# Patient Record
Sex: Female | Born: 1980 | Race: White | Hispanic: No | Marital: Married | Smoking: Former smoker
Health system: Southern US, Community
[De-identification: ages and names within clinical notes are randomized; demographics above are authoritative.]

## PROBLEM LIST (undated history)

## (undated) DIAGNOSIS — F419 Anxiety disorder, unspecified: Secondary | ICD-10-CM

## (undated) DIAGNOSIS — G629 Polyneuropathy, unspecified: Secondary | ICD-10-CM

## (undated) HISTORY — DX: Polyneuropathy, unspecified: G62.9

## (undated) HISTORY — PX: TONSILLECTOMY: SUR1361

## (undated) HISTORY — DX: Anxiety disorder, unspecified: F41.9

---

## 2010-10-21 ENCOUNTER — Encounter: Payer: Self-pay | Admitting: Family Medicine

## 2010-10-21 ENCOUNTER — Ambulatory Visit (INDEPENDENT_AMBULATORY_CARE_PROVIDER_SITE_OTHER): Payer: BC Managed Care – PPO | Admitting: Family Medicine

## 2010-10-21 VITALS — BP 143/81 | HR 94 | Ht 65.0 in | Wt 203.0 lb

## 2010-10-21 DIAGNOSIS — G629 Polyneuropathy, unspecified: Secondary | ICD-10-CM

## 2010-10-21 DIAGNOSIS — G43009 Migraine without aura, not intractable, without status migrainosus: Secondary | ICD-10-CM

## 2010-10-21 DIAGNOSIS — G589 Mononeuropathy, unspecified: Secondary | ICD-10-CM

## 2010-10-21 MED ORDER — GABAPENTIN 300 MG PO CAPS: 300.0000 mg | ORAL_CAPSULE | Freq: Every day | ORAL | Status: DC

## 2010-10-21 NOTE — Patient Instructions (Signed)
WE will call you with the referral to Neurology.

## 2010-10-21 NOTE — Progress Notes (Signed)
Subjective:    Patient ID: Leah Wells, female    DOB: 1980-08-03, 30 y.o.   MRN: [id number]  HPI 2 years ago started having severe HA, no sensitivity to light. They lasted for day.  Neg Head CT.  Would get facial swelling at the same time.   Saw Allergist who said she was allergic to dust mites  HA got better overall.  Lets started swelling around the ankles. Then had an echo and then the cardiologist who said the she had wall thickening.  Then saw another cardiologist because felt she really couldn't understand him.  Second cardiologist said she didn't need bp meds and felt she had no heart problems.  Was having SOB with the CP.  Dr. Beverely Pace at [facility].   Last week her right side felt like it was burning. Right now both legs are burning.  Will get shooting pains on her right side.   Her right arm, right leg and right eye were swollen at the same time. Saw PCP. Then next day felt like she couldn't feel her left side.  She felt lightheaded so went ahead to the ED.  Went to the [facility] ED and did a CXR and EKG and these were normal so went home. Next day still felt poorly.  Missed 3 days of work.  Was taking vicodin to get through work on Monday. Then went to [facility] ED and they did a Head CT and it ws neg.  She was dx with neuropathy and she was put on neuropathy.   Leg and arm swollen is even between the joints.    Cousin with Lupus.   Mom with fibroyalgia.   MGM with DM, TIAs, diabetic neuropahty PGM with renal failure and MIm Pat aunt with MS, crohns dz   Review of Systems     Objective:   Physical Exam  Constitutional: She is oriented to person, place, and time. She appears well-developed and well-nourished. No distress.  HENT:  Head: Normocephalic and atraumatic.  Eyes: Conjunctivae and EOM are normal. Pupils are equal, round, and reactive to light.  Neck: Neck supple. No thyromegaly present.  Cardiovascular: Normal rate, regular rhythm and normal heart sounds.         Normal radial pulses.   Pulmonary/Chest: Effort normal and breath sounds normal.  Musculoskeletal: She exhibits no edema.  Lymphadenopathy:    She has no cervical adenopathy.  Neurological: She is alert and oriented to person, place, and time. She has normal strength and normal reflexes. She displays normal reflexes. No cranial nerve deficit. She exhibits normal muscle tone. She displays a negative Romberg sign. Coordination normal.  Reflex Scores:      Tricep reflexes are 2+ on the right side and 2+ on the left side.      Bicep reflexes are 2+ on the right side and 2+ on the left side.      Brachioradialis reflexes are 2+ on the right side and 2+ on the left side.      Patellar reflexes are 2+ on the right side and 2+ on the left side. Skin: Skin is warm and dry.  Psychiatric: She has a normal mood and affect. Her behavior is normal. Judgment and thought content normal.          Assessment & Plan:  Neuropathy possible. Unclear etiololgy. Head CT has ruled out stroke or large tumor.  She has normal neuro exam today. Her pain has improved on the gabapentin. Will continue med for  now since getting benefit. Will refer to neuro for further eval. This doesn' t sound like SLE or MS.  She also brought in copies of her recent lab work that showed a neg ANA, normal sed rate, thyroid, liver, kidneys, etc. B12 was borderline.  Rec we recheck in 1 months. She recently had  B12 injections.  Explained taht B12 can cause neuropathy but does quite fit with the pattern that she is experiencing.    Migraines - BAsed on the severity of her HA I do think she likely has migraines. Neurontin may help her HA as well. Consider trial of tryptan. 30 + min spent face to face.

## 2010-10-23 DIAGNOSIS — G43009 Migraine without aura, not intractable, without status migrainosus: Secondary | ICD-10-CM | POA: Insufficient documentation

## 2010-10-24 ENCOUNTER — Encounter: Payer: Self-pay | Admitting: Family Medicine

## 2010-10-24 NOTE — Progress Notes (Signed)
  Subjective:    Patient ID: Leah Wells, female    DOB: 04/27/1981, 30 y.o.   MRN: [id number]  HPI    Review of Systems  Constitutional: Negative for fever, diaphoresis and unexpected weight change.  HENT: Positive for rhinorrhea, sneezing and postnasal drip. Negative for hearing loss and tinnitus.   Eyes: Positive for visual disturbance.  Respiratory: Positive for cough. Negative for wheezing.   Cardiovascular: Positive for chest pain. Negative for palpitations.  Gastrointestinal: Positive for nausea. Negative for vomiting, diarrhea and blood in stool.  Genitourinary: Negative for vaginal bleeding, vaginal discharge and difficulty urinating.  Musculoskeletal: Positive for myalgias and arthralgias.  Skin: Negative for rash.  Neurological: Positive for headaches.  Hematological: Negative for adenopathy. Does not bruise/bleed easily.  Psychiatric/Behavioral: Negative for sleep disturbance and dysphoric mood. The patient is nervous/anxious.        Objective:   Physical Exam        Assessment & Plan:

## 2010-11-01 ENCOUNTER — Telehealth: Payer: Self-pay | Admitting: Family Medicine

## 2010-11-01 DIAGNOSIS — I517 Cardiomegaly: Secondary | ICD-10-CM | POA: Insufficient documentation

## 2010-11-01 DIAGNOSIS — G4730 Sleep apnea, unspecified: Secondary | ICD-10-CM | POA: Insufficient documentation

## 2010-11-01 DIAGNOSIS — M2550 Pain in unspecified joint: Secondary | ICD-10-CM

## 2010-11-01 DIAGNOSIS — D510 Vitamin B12 deficiency anemia due to intrinsic factor deficiency: Secondary | ICD-10-CM | POA: Insufficient documentation

## 2010-11-01 DIAGNOSIS — F411 Generalized anxiety disorder: Secondary | ICD-10-CM

## 2010-11-01 DIAGNOSIS — R5383 Other fatigue: Secondary | ICD-10-CM

## 2010-11-01 DIAGNOSIS — E669 Obesity, unspecified: Secondary | ICD-10-CM

## 2010-11-01 NOTE — Telephone Encounter (Signed)
Chart updated from old records.

## 2010-11-02 ENCOUNTER — Telehealth: Payer: Self-pay | Admitting: *Deleted

## 2010-11-02 DIAGNOSIS — M79606 Pain in leg, unspecified: Secondary | ICD-10-CM

## 2010-11-02 NOTE — Telephone Encounter (Signed)
We can increase the neurontin adn set her up for a vascular study to make sure doesn't have a DVT. Also let get labs to rule out infection, etc.

## 2010-11-02 NOTE — Telephone Encounter (Signed)
Pt notified.will set up vascular study

## 2010-11-02 NOTE — Telephone Encounter (Signed)
Pt was seen in the office a few days ago for leg pain by Dr. Linford Arnold.Pt called today crying because she states the pain is so bad that she can barley walk.Pt states the neurontin  has not helped at all.Please advise

## 2010-11-03 ENCOUNTER — Other Ambulatory Visit: Payer: Self-pay | Admitting: Family Medicine

## 2010-11-03 LAB — CBC WITH DIFFERENTIAL/PLATELET
Basophils Absolute: 0 10*3/uL (ref 0.0–0.1)
HCT: 40.8 % (ref 36.0–46.0)
Hemoglobin: 13.2 g/dL (ref 12.0–15.0)
Lymphocytes Relative: 36 % (ref 12–46)
Lymphs Abs: 2.3 10*3/uL (ref 0.7–4.0)
Monocytes Absolute: 0.3 10*3/uL (ref 0.1–1.0)
Monocytes Relative: 5 % (ref 3–12)
Neutro Abs: 3.7 10*3/uL (ref 1.7–7.7)
RBC: 4.41 MIL/uL (ref 3.87–5.11)
WBC: 6.4 10*3/uL (ref 4.0–10.5)

## 2010-11-03 MED ORDER — GABAPENTIN 300 MG PO CAPS: 300.0000 mg | ORAL_CAPSULE | Freq: Two times a day (BID) | ORAL | Status: DC

## 2010-11-04 ENCOUNTER — Telehealth: Payer: Self-pay | Admitting: *Deleted

## 2010-11-04 LAB — COMPLETE METABOLIC PANEL WITH GFR
ALT: 16 U/L (ref 0–35)
AST: 18 U/L (ref 0–37)
Creat: 0.91 mg/dL (ref 0.40–1.20)
Sodium: 141 mEq/L (ref 135–145)
Total Bilirubin: 0.5 mg/dL (ref 0.3–1.2)

## 2010-11-04 LAB — D-DIMER, QUANTITATIVE: D-Dimer, Quant: 0.26 ug/mL-FEU (ref 0.00–0.48)

## 2010-11-04 NOTE — Telephone Encounter (Signed)
Left message on pt vm that venous doppler scheduled for 11/07/10 at 10am in [facility]

## 2010-11-07 ENCOUNTER — Telehealth: Payer: Self-pay | Admitting: Family Medicine

## 2010-11-07 NOTE — Telephone Encounter (Signed)
Misty Stanley called from here at the [facility]. To determine more specific results regarding vascular study.   Plan:  Told to do bilateral vascular study for both legs since both hurting but Rt leg worse.  R/o DVT and leg pain is the diagnosis. Jarvis Newcomer, LPN [facility]

## 2010-11-07 NOTE — Telephone Encounter (Signed)
Contacted pt.  Had to [facility] that test negative.  Told to keep neuro referral. Jarvis Newcomer, LPN [facility]

## 2010-11-07 NOTE — Telephone Encounter (Signed)
Call pt: Please let know it is negative. Thank you!!!!!!She already has a neuro referral.

## 2010-11-07 NOTE — Telephone Encounter (Signed)
Misty Stanley in [place] called with oral report.  Bilateral venous ultrasound negative for clots in either RT or LT legs. Plan:  Routed report to Dr. Marlyne Beards, LPN [facility]

## 2010-11-09 ENCOUNTER — Telehealth: Payer: Self-pay | Admitting: Family Medicine

## 2010-11-09 NOTE — Telephone Encounter (Signed)
Pt called and wanted her recent lab results and faxed to her. Plan:  Labs faxed to pt per her request. Jarvis Newcomer, LPN [facility]

## 2010-11-11 ENCOUNTER — Encounter: Payer: Self-pay | Admitting: Emergency Medicine

## 2010-11-11 ENCOUNTER — Inpatient Hospital Stay (INDEPENDENT_AMBULATORY_CARE_PROVIDER_SITE_OTHER)
Admission: RE | Admit: 2010-11-11 | Discharge: 2010-11-11 | Disposition: A | Discharge status: Home / Self Care | Payer: BC Managed Care – PPO | Source: Ambulatory Visit | Attending: Emergency Medicine | Admitting: Emergency Medicine

## 2010-11-11 DIAGNOSIS — J069 Acute upper respiratory infection, unspecified: Secondary | ICD-10-CM

## 2010-11-23 ENCOUNTER — Encounter: Payer: Self-pay | Admitting: Family Medicine

## 2011-04-16 ENCOUNTER — Inpatient Hospital Stay (INDEPENDENT_AMBULATORY_CARE_PROVIDER_SITE_OTHER)
Admission: RE | Admit: 2011-04-16 | Discharge: 2011-04-16 | Disposition: A | Discharge status: Home / Self Care | Payer: BC Managed Care – PPO | Source: Ambulatory Visit | Attending: Family Medicine | Admitting: Family Medicine

## 2011-04-16 ENCOUNTER — Encounter: Payer: Self-pay | Admitting: Family Medicine

## 2011-04-16 DIAGNOSIS — R599 Enlarged lymph nodes, unspecified: Secondary | ICD-10-CM

## 2011-04-16 DIAGNOSIS — H9209 Otalgia, unspecified ear: Secondary | ICD-10-CM

## 2011-04-16 LAB — CONVERTED CEMR LAB: Rapid Strep: NEGATIVE

## 2011-05-30 ENCOUNTER — Encounter: Payer: Self-pay | Admitting: Family Medicine

## 2011-05-30 ENCOUNTER — Ambulatory Visit (INDEPENDENT_AMBULATORY_CARE_PROVIDER_SITE_OTHER): Payer: BC Managed Care – PPO | Admitting: Family Medicine

## 2011-05-30 VITALS — BP 140/89 | HR 81 | Temp 98.2°F | Ht 63.0 in | Wt 178.0 lb

## 2011-05-30 DIAGNOSIS — J209 Acute bronchitis, unspecified: Secondary | ICD-10-CM

## 2011-05-30 DIAGNOSIS — Z23 Encounter for immunization: Secondary | ICD-10-CM

## 2011-05-30 DIAGNOSIS — N926 Irregular menstruation, unspecified: Secondary | ICD-10-CM

## 2011-05-30 DIAGNOSIS — N912 Amenorrhea, unspecified: Secondary | ICD-10-CM

## 2011-05-30 DIAGNOSIS — J3489 Other specified disorders of nose and nasal sinuses: Secondary | ICD-10-CM

## 2011-05-30 DIAGNOSIS — J0100 Acute maxillary sinusitis, unspecified: Secondary | ICD-10-CM

## 2011-05-30 MED ORDER — FEXOFENADINE-PSEUDOEPHED ER 180-240 MG PO TB24: 1.0000 | ORAL_TABLET | Freq: Every day | ORAL | Status: DC

## 2011-05-30 MED ORDER — HYDROCOD POLST-CHLORPHEN POLST 10-8 MG/5ML PO LQCR
5.0000 mL | Freq: Two times a day (BID) | ORAL | Status: DC | PRN
Start: 2011-05-30 — End: 2011-06-07

## 2011-05-30 MED ORDER — AMOXICILLIN-POT CLAVULANATE 875-125 MG PO TABS: 1.0000 | ORAL_TABLET | Freq: Two times a day (BID) | ORAL | Status: AC

## 2011-05-30 NOTE — Progress Notes (Signed)
Subjective:    Patient ID: Leah Wells, female    DOB: [birth year], 30 y.o.   MRN: [id number]  Cough This is a new problem. The current episode started 1 to 4 weeks ago. The problem has been gradually worsening. The problem occurs hourly. The cough is productive of purulent sputum. Associated symptoms include ear congestion, postnasal drip, a sore throat and weight loss. Pertinent negatives include no chest pain, chills, ear pain, fever, headaches, hemoptysis, myalgias, rash, shortness of breath or wheezing. The symptoms are aggravated by lying down and exercise. She has tried OTC cough suppressant for the symptoms. The treatment provided no relief. There is no history of asthma, bronchiectasis, bronchitis, COPD, emphysema or pneumonia.  Sinusitis This is a new problem. The current episode started 1 to 4 weeks ago. The problem has been gradually worsening since onset. There has been no fever. Associated symptoms include coughing and a sore throat. Pertinent negatives include no chills, ear pain, headaches or shortness of breath. Past treatments include oral decongestants. The treatment provided no relief.  #2 patient has been on Depo-Provera she can't remember the last dose and she was supposed to get it been a few months since that she's not had a missed. She does states missed periods are regular anyway and we will get a tendency testis to make sure that she's not pregnant she is currently not using any type of backup method to prevent pregnancy. #3 the flu vaccine    Review of Systems  Constitutional: Positive for weight loss. Negative for fever and chills.  HENT: Positive for sore throat and postnasal drip. Negative for ear pain.   Respiratory: Positive for cough. Negative for hemoptysis, shortness of breath and wheezing.   Cardiovascular: Negative for chest pain.  Genitourinary: Positive for menstrual problem.  Musculoskeletal: Negative for myalgias.       Neuropathy  Skin: Negative for  rash.  Neurological: Negative for headaches.  All other systems reviewed and are negative.     BP 140/89  Pulse 81  Temp(Src) 98.2 F (36.8 C) (Oral)  Ht 5\' 3"  (1.6 m)  Wt 178 lb (80.74 kg)  BMI 31.53 kg/m2  SpO2 100%  Objective:   Physical Exam  Nursing note and vitals reviewed. Constitutional: She is oriented to person, place, and time. She appears well-developed and well-nourished. No distress.  HENT:  Head: Normocephalic and atraumatic.  Right Ear: Tympanic membrane, external ear and ear canal normal.  Left Ear: Tympanic membrane, external ear and ear canal normal.  Nose: Mucosal edema and rhinorrhea present. Right sinus exhibits maxillary sinus tenderness. Left sinus exhibits maxillary sinus tenderness.  Mouth/Throat: No oral lesions. Posterior oropharyngeal erythema present. No oropharyngeal exudate.  Eyes: Right eye exhibits no discharge. Left eye exhibits no discharge. No scleral icterus.  Neck: Normal range of motion. Neck supple.  Cardiovascular: Normal rate, regular rhythm and normal heart sounds.   Pulmonary/Chest: Effort normal and breath sounds normal. She has no wheezes. She has no rales.  Lymphadenopathy:    She has no cervical adenopathy.  Neurological: She is alert and oriented to person, place, and time.  Skin: Skin is warm and dry.          Results for orders placed in visit on 05/30/11  POCT URINE PREGNANCY      Component Value Range   Preg Test, Ur Negative      Assessment & Plan:  #1 missed menses pregnancy test was negative patient reassured I did also warn her that she may  get pregnant before she has a period since there is  no backup birth control this time. #2 bronchitis and sinusitis we'll treat with Desenex 1 teaspoon by mouth twice a day to help with cough Allegra-D one tablet a day 24 hours and Augmentin 875 one tablet twice a day if she's not a lot better in a week's time to please come back. Also work note for the rest today and  tomorrow. #3 we'll give patient a flu shot

## 2011-05-30 NOTE — Patient Instructions (Signed)
Sinusitis Sinuses are air pockets within the bones of your face. The growth of bacteria within a sinus leads to infection. The infection prevents the sinuses from draining. This infection is called sinusitis. SYMPTOMS  There will be different areas of pain depending on which sinuses have become infected.  The maxillary sinuses often produce pain beneath the eyes.   Frontal sinusitis may cause pain in the middle of the forehead and above the eyes.  Other problems (symptoms) include:  Toothaches.   Colored, pus-like (purulent) drainage from the nose.   Swelling, warmth, and tenderness over the sinus areas may be signs of infection.  TREATMENT  Sinusitis is most often determined by an exam.X-rays may be taken. If x-rays have been taken, make sure you obtain your results or find out how you are to obtain them. Your caregiver may give you medications (antibiotics). These are medications that will help kill the bacteria causing the infection. You may also be given a medication (decongestant) that helps to reduce sinus swelling.  HOME CARE INSTRUCTIONS   Only take over-the-counter or prescription medicines for pain, discomfort, or fever as directed by your caregiver.   Drink extra fluids. Fluids help thin the mucus so your sinuses can drain more easily.   Applying either moist heat or ice packs to the sinus areas may help relieve discomfort.   Use saline nasal sprays to help moisten your sinuses. The sprays can be found at your local drugstore.  SEEK IMMEDIATE MEDICAL CARE IF:  You have a fever.   You have increasing pain, severe headaches, or toothache.   You have nausea, vomiting, or drowsiness.   You develop unusual swelling around the face or trouble seeing.  MAKE SURE YOU:   Understand these instructions.   Will watch your condition.   Will get help right away if you are not doing well or get worse.  Document Released: 06/19/2005 Document Revised: 03/01/2011 Document Reviewed:  01/16/2007 Exit[employer] Patient Information 2012 [employer].   Bronchitis Bronchitis is the body's way of reacting to injury and/or infection (inflammation) of the bronchi. Bronchi are the air tubes that extend from the windpipe into the lungs. If the inflammation becomes severe, it may cause shortness of breath. CAUSES  Inflammation may be caused by:  A virus.   Germs (bacteria).   Dust.   Allergens.   Pollutants and many other irritants.  The cells lining the bronchial tree are covered with tiny hairs (cilia). These constantly beat upward, away from the lungs, toward the mouth. This keeps the lungs free of pollutants. When these cells become too irritated and are unable to do their job, mucus begins to develop. This causes the characteristic cough of bronchitis. The cough clears the lungs when the cilia are unable to do their job. Without either of these protective mechanisms, the mucus would settle in the lungs. Then you would develop pneumonia. Smoking is a common cause of bronchitis and can contribute to pneumonia. Stopping this habit is the single most important thing you can do to help yourself. TREATMENT   Your caregiver may prescribe an antibiotic if the cough is caused by bacteria. Also, medicines that open up your airways make it easier to breathe. Your caregiver may also recommend or prescribe an expectorant. It will loosen the mucus to be coughed up. Only take over-the-counter or prescription medicines for pain, discomfort, or fever as directed by your caregiver.   Removing whatever causes the problem (smoking, for example) is critical to preventing the problem   from getting worse.   Cough suppressants may be prescribed for relief of cough symptoms.   Inhaled medicines may be prescribed to help with symptoms now and to help prevent problems from returning.   For those with recurrent (chronic) bronchitis, there may be a need for steroid medicines.  SEEK IMMEDIATE MEDICAL  CARE IF:   During treatment, you develop more pus-like mucus (purulent sputum).   You have a fever.   Your baby is older than 3 months with a rectal temperature of 102 F (38.9 C) or higher.   Your baby is 3 months old or younger with a rectal temperature of 100.4 F (38 C) or higher.   You become progressively more ill.   You have increased difficulty breathing, wheezing, or shortness of breath.  It is necessary to seek immediate medical care if you are elderly or sick from any other disease. MAKE SURE YOU:   Understand these instructions.   Will watch your condition.   Will get help right away if you are not doing well or get worse.  Document Released: 06/19/2005 Document Revised: 03/01/2011 Document Reviewed: 04/28/2008 Exit[employer] Patient Information 2012 [employer]. 

## 2011-06-05 ENCOUNTER — Telehealth: Payer: Self-pay | Admitting: *Deleted

## 2011-06-05 DIAGNOSIS — R05 Cough: Secondary | ICD-10-CM

## 2011-06-05 NOTE — Telephone Encounter (Signed)
We can get CXR if cough is not any better.

## 2011-06-05 NOTE — Progress Notes (Signed)
Summary: RT EAR PAIN   Vital Signs:  Patient Profile:   30 Years Old Female CC:      ear complaints Height:     63 inches Weight:      177 pounds O2 Sat:      100 % O2 treatment:    Room Air Temp:     98.3 degrees F oral Pulse rate:   80 / minute Resp:     16 per minute BP sitting:   131 / 81  (left arm) Cuff size:   large  Pt. in pain?   yes    Location:   ears  Vitals Entered By: Lavell Islam RN (April 16, 2011 2:23 PM)                   Updated Prior Medication List: * DEPO PROVERA INJECTIONS   Current Allergies: ! * DUST MITESHistory of Present Illness Chief Complaint: ear complaints History of Present Illness:  Subjective: Patient complains of burning sensation in her right ear for two days.   No sore throat No cough No pleuritic pain No wheezing No nasal congestion No post-nasal drainage No sinus pain/pressure No itchy/red eyes No hemoptysis No SOB No fever/chills No nausea No vomiting No abdominal pain No diarrhea No skin rashes + fatigue No myalgias No headache   REVIEW OF SYSTEMS Constitutional Symptoms      Denies fever, chills, night sweats, weight loss, weight gain, and fatigue.  Eyes       Denies change in vision, eye pain, eye discharge, glasses, contact lenses, and eye surgery. Ear/Nose/Throat/Mouth       Complains of ear pain.      Denies hearing loss/aids, change in hearing, ear discharge, dizziness, frequent runny nose, frequent nose bleeds, sinus problems, sore throat, hoarseness, and tooth pain or bleeding.  Respiratory       Denies dry cough, productive cough, wheezing, shortness of breath, asthma, bronchitis, and emphysema/COPD.  Cardiovascular       Denies murmurs, chest pain, and tires easily with exhertion.    Gastrointestinal       Denies stomach pain, nausea/vomiting, diarrhea, constipation, blood in bowel movements, and indigestion. Genitourniary       Denies painful urination, kidney stones, and loss of urinary  control. Neurological       Denies paralysis, seizures, and fainting/blackouts. Musculoskeletal       Denies muscle pain, joint pain, joint stiffness, decreased range of motion, redness, swelling, muscle weakness, and gout.  Skin       Denies bruising, unusual mles/lumps or sores, and hair/skin or nail changes.  Psych       Denies mood changes, temper/anger issues, anxiety/stress, speech problems, depression, and sleep problems. Other Comments: ear concerns bilaterally   Past History:  Past Medical History: Reviewed history from 11/11/2010 and no changes required. neuropathy legs and arms  Past Surgical History: Reviewed history from 11/11/2010 and no changes required. Caesarean section  Family History: Reviewed history from 11/11/2010 and no changes required. none  Social History: Reviewed history from 11/11/2010 and no changes required. Never Smoked Alcohol use-no Drug use-no   Objective:  Appearance:  Patient appears healthy, stated age, and in no acute distress  Eyes:  Pupils are equal, round, and reactive to light and accomodation.  Extraocular movement is intact.  Conjunctivae are not inflamed.  Ears:  Canals normal.  Tympanic membranes normal.  No TMJ tenderness Nose:  Mildly congested turbinates.  No sinus tenderness  Pharynx:  Normal  Neck:  Supple. Tender shotty posterior nodes are palpated on the right Lungs:  Clear to auscultation.  Breath sounds are equal.  Heart:  Regular rate and rhythm without murmurs, rubs, or gallops.  Abdomen:  Nontender without masses or hepatosplenomegaly.  Bowel sounds are present.  No CVA or flank tenderness.  Skin:  No rash.  No scalp lesions Rapid strep test negative   Assessment New Problems: CERVICAL LYMPHADENOPATHY (ICD-785.6) EAR PAIN, RIGHT (ICD-388.70)  ? EARLY HERPES ZOSTER  Plan New Medications/Changes: VALTREX 1 GM TABS (VALACYCLOVIR HCL) One by mouth q8hr for shingles  #21 x 0, 04/16/2011, Donna Christen  MD  New Orders: Rapid Strep [96045] T-Culture, Throat [[id number]] Est. Patient Level III [82956] Services provided After hours-Weekends-Holidays [99051] Planning Comments:   Throat culture pending Empirically begin Valtrex. Follow-up with PCP if not improving   The patient and/or caregiver has been counseled thoroughly with regard to medications prescribed including dosage, schedule, interactions, rationale for use, and possible side effects and they verbalize understanding.  Diagnoses and expected course of recovery discussed and will return if not improved as expected or if the condition worsens. Patient and/or caregiver verbalized understanding.  Prescriptions: VALTREX 1 GM TABS (VALACYCLOVIR HCL) One by mouth q8hr for shingles  #21 x 0   Entered and Authorized by:   Donna Christen MD   Signed by:   Donna Christen MD on 04/16/2011   Method used:   Print then Give to Patient   RxID:   [id number]   Orders Added: 1)  Rapid Strep [28413] 2)  T-Culture, Throat [[id number]] 3)  Est. Patient Level III [36644] 4)  Services provided After hours-Weekends-Holidays [99051]    Laboratory Results  Date/Time Received: April 16, 2011 2:58 PM  Date/Time Reported: April 16, 2011 2:58 PM   Other Tests  Rapid Strep: negative  Kit Test Internal QC: Negative   (Normal Range: Negative)

## 2011-06-05 NOTE — Progress Notes (Signed)
Summary: COUGH,CONGESTION,SORE THROAT/TJrm 4   Vital Signs:  Patient Profile:   30 Years Old Female CC:      Cough x 3 days Height:     63 inches Weight:      200.25 pounds O2 Sat:      100 % O2 treatment:    Room Air Temp:     98.6 degrees F oral Pulse rate:   88 / minute Resp:     18 per minute BP sitting:   124 / 80  (left arm) Cuff size:   regular  Vitals Entered By: Clemens Catholic LPN (Nov 11, 2010 5:46 PM)                  Updated Prior Medication List: NEURONTIN 300 MG CAPS (GABAPENTIN)   Current Allergies: No known allergies History of Present Illness History from: patient Chief Complaint: Cough x 3 days History of Present Illness: 30 Years Old Female complains of onset of cold symptoms for 3 days.  Citlaly has been using Sudafed and OTC cold meds which is helping a little bit. + sore throat improving + cough No pleuritic pain No wheezing + nasal congestion + post-nasal drainage + sinus pain/pressure No chest congestion No itchy/red eyes No earache No hemoptysis No SOB No chills/sweats + fever No nausea No vomiting No abdominal pain No diarrhea No skin rashes No fatigue No myalgias No headache   REVIEW OF SYSTEMS Constitutional Symptoms       Complains of fever, chills, and night sweats.     Denies weight loss, weight gain, and fatigue.  Eyes       Complains of glasses.      Denies change in vision, eye pain, eye discharge, contact lenses, and eye surgery. Ear/Nose/Throat/Mouth       Complains of ear pain, frequent runny nose, sinus problems, sore throat, and hoarseness.      Denies hearing loss/aids, change in hearing, ear discharge, dizziness, frequent nose bleeds, and tooth pain or bleeding.  Respiratory       Complains of productive cough.      Denies dry cough, wheezing, shortness of breath, asthma, bronchitis, and emphysema/COPD.  Cardiovascular       Denies murmurs, chest pain, and tires easily with exhertion.    Gastrointestinal      Denies stomach pain, nausea/vomiting, diarrhea, constipation, blood in bowel movements, and indigestion. Genitourniary       Denies painful urination, kidney stones, and loss of urinary control. Neurological       Denies paralysis, seizures, and fainting/blackouts. Musculoskeletal       Denies muscle pain, joint pain, joint stiffness, decreased range of motion, redness, swelling, muscle weakness, and gout.  Skin       Denies bruising, unusual mles/lumps or sores, and hair/skin or nail changes.  Psych       Denies mood changes, temper/anger issues, anxiety/stress, speech problems, depression, and sleep problems. Other Comments: pt c/o productive cough, sore throat, runny nose, head congestion and fever (100.0 last night). sore throat has resolved. she has taken benadryl and Sudafed w/ IBF.   Past History:  Past Medical History: neuropathy legs and arms  Past Surgical History: Caesarean section  Family History: none  Social History: Never Smoked Alcohol use-no Drug use-no Smoking Status:  never Drug Use:  no Physical Exam General appearance: well developed, well nourished, no acute distress Ears: mild TM erythema on R, eustachian tube dysfx Nasal: mucosa pink, nonedematous, no septal deviation, turbinates normal Oral/Pharynx:  tongue normal, posterior pharynx without erythema or exudate Chest/Lungs: no rales, wheezes, or rhonchi bilateral, breath sounds equal without effort Heart: regular rate and  rhythm, no murmur MSE: oriented to time, place, and person Assessment New Problems: UPPER RESPIRATORY INFECTION, ACUTE (ICD-465.9)   Plan New Medications/Changes: AMOXICILLIN 875 MG TABS (AMOXICILLIN) 1 by mouth two times a day for 7 days  #14 x 0, 11/11/2010, Hoyt Koch MD PREDNISONE (PAK) 10 MG TABS (PREDNISONE) use as directed, 6 day pack  #1 x 0, 11/11/2010, Hoyt Koch MD  New Orders: New Patient Level III [phone] Pulse Oximetry (single measurment)  [94760] Services provided After hours-Weekends-Holidays [99051] Planning Comments:   1)  Take the prescribed antibiotic as instructed.  Hold for a few days.  First take Prednisone and see if that makes you better.  If worsening symptoms, fever, can use ABX. 2)  Use nasal saline solution (over the counter) at least 3 times a day. 3)  Use over the counter decongestants like Zyrtec-D every 12 hours as needed to help with congestion. 4)  Can take tylenol every 6 hours or motrin every 8 hours for pain or fever. 5)  Follow up with your primary doctor  if no improvement in 5-7 days, sooner if increasing pain, fever, or new symptoms.    The patient and/or caregiver has been counseled thoroughly with regard to medications prescribed including dosage, schedule, interactions, rationale for use, and possible side effects and they verbalize understanding.  Diagnoses and expected course of recovery discussed and will return if not improved as expected or if the condition worsens. Patient and/or caregiver verbalized understanding.  Prescriptions: AMOXICILLIN 875 MG TABS (AMOXICILLIN) 1 by mouth two times a day for 7 days  #14 x 0   Entered and Authorized by:   Hoyt Koch MD   Signed by:   Hoyt Koch MD on 11/11/2010   Method used:   Print then Give to Patient   RxID:   [id number] PREDNISONE (PAK) 10 MG TABS (PREDNISONE) use as directed, 6 day pack  #1 x 0   Entered and Authorized by:   Hoyt Koch MD   Signed by:   Hoyt Koch MD on 11/11/2010   Method used:   Print then Give to Patient   RxID:   [id number]   Orders Added: 1)  New Patient Level III [29528] 2)  Pulse Oximetry (single measurment) [94760] 3)  Services provided After hours-Weekends-Holidays [41324]

## 2011-06-05 NOTE — Telephone Encounter (Signed)
Pt seen last week by Dr. Thurmond Butts and was told if no better to come back in and she can not afford another copay. Given antibiotic and cough med. Still chest sore, cough. Sinus infection feels better but cough is still bad and gets SOB.

## 2011-06-06 ENCOUNTER — Ambulatory Visit
Admission: RE | Admit: 2011-06-06 | Discharge: 2011-06-06 | Disposition: A | Discharge status: Home / Self Care | Payer: BC Managed Care – PPO | Source: Ambulatory Visit | Attending: Family Medicine | Admitting: Family Medicine

## 2011-06-06 ENCOUNTER — Telehealth: Payer: Self-pay | Admitting: Family Medicine

## 2011-06-06 NOTE — Telephone Encounter (Signed)
Pt notified. KJ LPN 

## 2011-06-06 NOTE — Telephone Encounter (Signed)
Patient called left a voice messg that she request a call back from Dr. Linford Arnold but she did not state what the call was in reference to.Marland KitchenMarland Kitchen

## 2011-06-06 NOTE — Telephone Encounter (Signed)
Pt calling about xray results.  °

## 2011-06-07 ENCOUNTER — Other Ambulatory Visit: Payer: Self-pay | Admitting: Family Medicine

## 2011-06-07 DIAGNOSIS — J209 Acute bronchitis, unspecified: Secondary | ICD-10-CM

## 2011-06-07 DIAGNOSIS — J0100 Acute maxillary sinusitis, unspecified: Secondary | ICD-10-CM

## 2011-06-07 MED ORDER — HYDROCOD POLST-CHLORPHEN POLST 10-8 MG/5ML PO LQCR: 5.0000 mL | Freq: Two times a day (BID) | ORAL | Status: DC | PRN

## 2011-06-30 ENCOUNTER — Encounter: Payer: Self-pay | Admitting: Physician Assistant

## 2011-06-30 ENCOUNTER — Ambulatory Visit (INDEPENDENT_AMBULATORY_CARE_PROVIDER_SITE_OTHER): Payer: BC Managed Care – PPO | Admitting: Physician Assistant

## 2011-06-30 VITALS — BP 128/77 | HR 79 | Temp 98.2°F | Ht 64.0 in | Wt 183.0 lb

## 2011-06-30 DIAGNOSIS — R21 Rash and other nonspecific skin eruption: Secondary | ICD-10-CM

## 2011-06-30 DIAGNOSIS — J209 Acute bronchitis, unspecified: Secondary | ICD-10-CM

## 2011-06-30 DIAGNOSIS — J029 Acute pharyngitis, unspecified: Secondary | ICD-10-CM

## 2011-06-30 DIAGNOSIS — J0100 Acute maxillary sinusitis, unspecified: Secondary | ICD-10-CM

## 2011-06-30 MED ORDER — FEXOFENADINE-PSEUDOEPHED ER 180-240 MG PO TB24: 1.0000 | ORAL_TABLET | Freq: Every day | ORAL | Status: AC

## 2011-06-30 MED ORDER — TRIAMCINOLONE ACETONIDE 0.1 % EX OINT: TOPICAL_OINTMENT | Freq: Two times a day (BID) | CUTANEOUS | Status: DC

## 2011-06-30 NOTE — Patient Instructions (Addendum)
Start using ointment for rash on arm and leg. Only use small amount and do not use for more than 2 weeks without results. Start Allegra again for post nasal drip. Due to cost can take 1/2 tab a day or 1 tab every other day.   Eczema Atopic dermatitis, or eczema, is an inherited type of sensitive skin. Often people with eczema have a family history of allergies, asthma, or hay fever. It causes a red itchy rash and dry scaly skin. The itchiness may occur before the skin rash and may be very intense. It is not contagious. Eczema is generally worse during the cooler winter months and often improves with the warmth of summer. Eczema usually starts showing signs in infancy. Some children outgrow eczema, but it may last through adulthood. Flare-ups may be caused by:  Eating something or contact with something you are sensitive or allergic to.   Stress.  DIAGNOSIS  The diagnosis of eczema is usually based upon symptoms and medical history. TREATMENT  Eczema cannot be cured, but symptoms usually can be controlled with treatment or avoidance of allergens (things to which you are sensitive or allergic to).  Controlling the itching and scratching.   Use over-the-counter antihistamines as directed for itching. It is especially useful at night when the itching tends to be worse.   Use over-the-counter steroid creams as directed for itching.   Scratching makes the rash and itching worse and may cause impetigo (a skin infection) if fingernails are contaminated (dirty).   Keeping the skin well moisturized with creams every day. This will seal in moisture and help prevent dryness. Lotions containing alcohol and water can dry the skin and are not recommended.   Limiting exposure to allergens.   Recognizing situations that cause stress.   Developing a plan to manage stress.  HOME CARE INSTRUCTIONS   Take prescription and over-the-counter medicines as directed by your caregiver.   Do not use anything on  the skin without checking with your caregiver.   Keep baths or showers short (5 minutes) in warm (not hot) water. Use mild cleansers for bathing. You may add non-perfumed bath oil to the bath water. It is best to avoid soap and bubble bath.   Immediately after a bath or shower, when the skin is still damp, apply a moisturizing ointment to the entire body. This ointment should be a petroleum ointment. This will seal in moisture and help prevent dryness. The thicker the ointment the better. These should be unscented.   Keep fingernails cut short and wash hands often. If your child has eczema, it may be necessary to put soft gloves or mittens on your child at night.   Dress in clothes made of cotton or cotton blends. Dress lightly, as heat increases itching.   Avoid foods that may cause flare-ups. Common foods include cow's milk, peanut butter, eggs and wheat.   Keep a child with eczema away from anyone with fever blisters. The virus that causes fever blisters (herpes simplex) can cause a serious skin infection in children with eczema.  SEEK MEDICAL CARE IF:   Itching interferes with sleep.   The rash gets worse or is not better within one week following treatment.   The rash looks infected (pus or soft yellow scabs).   You or your child has an oral temperature above 102 F (38.9 C).   Your baby is older than 3 months with a rectal temperature of 100.5 F (38.1 C) or higher for more than 1  day.   The rash flares up after contact with someone who has fever blisters.  SEEK IMMEDIATE MEDICAL CARE IF:   Your baby is older than 3 months with a rectal temperature of 102 F (38.9 C) or higher.   Your baby is older than 3 months or younger with a rectal temperature of 100.4 F (38 C) or higher.  Document Released: 06/16/2000 Document Revised: 03/01/2011 Document Reviewed: 04/21/2009 [facility] Patient Information 2012 [place], [place].

## 2011-06-30 NOTE — Progress Notes (Signed)
  Subjective:    Patient ID: Leah Wells, female    DOB: 08-16-1980, 30 y.o.   MRN: [id number]  HPI Patient has had non productive cough for at least 2 months. Her cough is worse first thing in the morning and at night. She sleeps fine without the cough bothering her. Patient woke up with sore throat on Monday. It has remained unchanged since Monday. She has had no strep contacts. She "felt" like she ran a fever yesterday but has felt better today. Denies any symptoms acid reflux, nasal congestion, chills, or N/V. She has had a headache for about a week. Patient has a history of allergic rhinitis but has been unable to take her allegra due to cost. She reports this is the only thing that works. She has tried many other anti-histamines. She has not tried any other OTC for sore throat or cough.  Patient has rash on left forearm and back of left thigh. She reports they itch but do not burn or sting. She has had them about 2 months and they have remained unchanged. She has used lotion but has not helped. They appear more red and agitated after getting out of the shower. When she was young she had a lot of skin problems but has not had any since. She denies being in any moist environment.        Review of Systems  Constitutional: Positive for fever. Negative for chills and fatigue.  HENT: Positive for sore throat, postnasal drip and sinus pressure. Negative for ear pain, congestion, rhinorrhea, tinnitus and ear discharge.   Respiratory: Positive for cough. Negative for shortness of breath and wheezing.   Cardiovascular: Negative.   Skin: Positive for rash. Negative for color change.  Neurological: Positive for headaches.       Objective:   Physical Exam  Constitutional: She is oriented to person, place, and time. She appears well-developed and well-nourished.  HENT:  Head: Normocephalic and atraumatic.  Mouth/Throat: No oropharyngeal exudate.       Bilateral turbinates edematous. Oropharynx  is erythematous with post nasal drip evident. Bilateral ears present with a minimal amount of fluid.  Neck: Normal range of motion.       Anterior nodes enlarged bilaterally.  Cardiovascular: Normal rate, regular rhythm and normal heart sounds.   Pulmonary/Chest: Effort normal and breath sounds normal. She has no wheezes. She has no rales.  Lymphadenopathy:    She has cervical adenopathy.  Neurological: She is alert and oriented to person, place, and time.  Skin: Skin is dry. Rash noted.       7mm patch of scaly skin on the back thigh of left leg with some central clearing.  Psychiatric: She has a normal mood and affect. Her behavior is normal.          Assessment & Plan:  Sore throat due to post nasal drip- Centor Criteria for strep was 1. Patient was encouraged to start back on allegra. Due to patient not being able to afford using every day was instructed to use every other day or cut tab in half and use 1/2 every day.Symptomatic care for sore throat was given.  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 KOH obtained. Triamcinolone ointment given for patient to start on affected area using BID. Instructed to not use for more than 2 weeks continually.

## 2011-07-02 LAB — KOH PREP: RESULT - KOH: NONE SEEN

## 2011-08-06 ENCOUNTER — Emergency Department (INDEPENDENT_AMBULATORY_CARE_PROVIDER_SITE_OTHER)
Admission: EM | Admit: 2011-08-06 | Discharge: 2011-08-06 | Disposition: A | Discharge status: Home / Self Care | Payer: BC Managed Care – PPO | Source: Home / Self Care | Attending: Family Medicine | Admitting: Family Medicine

## 2011-08-06 ENCOUNTER — Encounter: Payer: Self-pay | Admitting: Emergency Medicine

## 2011-08-06 DIAGNOSIS — Z1329 Encounter for screening for other suspected endocrine disorder: Secondary | ICD-10-CM

## 2011-08-06 DIAGNOSIS — J029 Acute pharyngitis, unspecified: Secondary | ICD-10-CM

## 2011-08-06 LAB — POCT CBC W AUTO DIFF (K'VILLE URGENT CARE)

## 2011-08-06 LAB — POCT RAPID STREP A (OFFICE): Rapid Strep A Screen: NEGATIVE

## 2011-08-06 MED ORDER — PREDNISONE 10 MG PO TABS: ORAL_TABLET | ORAL | Status: DC

## 2011-08-06 NOTE — ED Notes (Signed)
Sore throat off and on for 2 weeks.

## 2011-08-06 NOTE — ED Provider Notes (Signed)
History     CSN: [id number]  Arrival date & time 08/06/11  1208   First MD Initiated Contact with Patient 08/06/11 1328      Chief Complaint  Patient presents with  . Sore Throat     HPI Comments: Complains of a persistent mild sore throat for a week.  Actually she has more soreness in her neck than in throat.  Notes that she feels like she has a lump in her throat.  She has been fatigued.  Minimal nasal congestion and cough.  ? Low grade fever. She has a family history of thyroid disease:  Her sister has thyroid cancer.  Her Maternal uncle has some type of thyroid disorder.  The history is provided by the patient.    History reviewed. No pertinent past medical history.  Past Surgical History  Procedure Date  . Cesarean section 12/09/2005    Family History  Problem Relation Age of Onset  . Breast cancer    . Lung cancer    . Thyroid cancer    . Heart attack    . Diabetes    . Hyperlipidemia    . Hypertension    . Stroke      History  Substance Use Topics  . Smoking status: Never Smoker   . Smokeless tobacco: Not on file  . Alcohol Use: No    OB History    Grav Para Term Preterm Abortions TAB SAB Ect Mult Living                  Review of Systems + sore throat + mild cough No pleuritic pain No wheezing No nasal congestion No post-nasal drainage No sinus pain/pressure No itchy/red eyes No earache No hemoptysis No SOB ? fever/chills No nausea No vomiting No abdominal pain No diarrhea No urinary symptoms No skin rashes + fatigue No myalgias + headache Used OTC meds without relief  Allergies  Dust mite extract  Home Medications   Current Outpatient Rx  Name Route Sig Dispense Refill  . TOPIRAMATE 100 MG PO TABS Oral Take 100 mg by mouth 2 (two) times daily.    Marland Kitchen FEXOFENADINE-PSEUDOEPHED ER 180-240 MG PO TB24 Oral Take 1 tablet by mouth daily. 30 tablet 1  . PREDNISONE 10 MG PO TABS  Take 2 tabs by mouth today, then two tabs twice daily for  two days, then one tab twice daily for 2 days, then 1 daily for two days.  Take PC 16 tablet 0  . TRIAMCINOLONE ACETONIDE 0.1 % EX OINT Topical Apply topically 2 (two) times daily. Do not use longer than 2 weeks. 30 g 0    BP 112/69  Pulse 85  Temp(Src) 98.7 F (37.1 C) (Oral)  Resp 16  Ht 5\' 4"  (1.626 m)  Wt 183 lb (83.008 kg)  BMI 31.41 kg/m2  SpO2 100%  Physical Exam Nursing notes and Vital Signs reviewed. Appearance:  Patient appears healthy, stated age, and in no acute distress Eyes:  Pupils are equal, round, and reactive to light and accomodation.  Extraocular movement is intact.  Conjunctivae are not inflamed  Ears:  Canals normal.  Tympanic membranes normal.  Nose:  Mildly congested turbinates.  No sinus tenderness.   Pharynx:  Normal Neck:  Supple.  Slightly tender shotty anterior/posterior nodes are palpated bilaterally.  There is mild tenderness over thyroid, but it is symmetric without nodules or hypertrophy. Lungs:  Clear to auscultation.  Breath sounds are equal.  Heart:  Regular rate and  rhythm without murmurs, rubs, or gallops.  Abdomen:  Mild tenderness over spleen and liver without masses or hepatosplenomegaly.  Bowel sounds are present.  No CVA or flank tenderness.  Extremities:  No edema.  No calf tenderness Skin:  No rash present.   ED Course  Procedures  none   Labs Reviewed  POCT RAPID STREP A (OFFICE) negative  POCT CBC W AUTO DIFF ([facility]) CBC:  WBC 7.7; LY 36.1; MO 4.5; GR 59.4; Hgb 13/7   STREP A DNA PROBE pending  TSH pending  T3, FREE pending  T4, FREE pending      1. Acute pharyngitis       MDM  Throat culture pending. Doubt mono. With tenderness over thyroid, must consider thyroiditis.  Thyroid studies pending. Begin tapering course of prednisone. Followup with PCP if not improving.        Donna Christen, MD 08/06/11 [phone]

## 2011-08-06 NOTE — ED Notes (Deleted)
Pt c/o sore throat, RT ear ache, HA, and nasal congestion x 2 days. She has taken OTC cold meds.

## 2011-08-07 LAB — T4, FREE: Free T4: 1.15 ng/dL (ref 0.80–1.80)

## 2011-08-07 LAB — T3, FREE: T3, Free: 2.8 pg/mL (ref 2.3–4.2)

## 2011-11-02 ENCOUNTER — Ambulatory Visit
Admission: RE | Admit: 2011-11-02 | Discharge: 2011-11-02 | Disposition: A | Discharge status: Home / Self Care | Payer: BC Managed Care – PPO | Source: Ambulatory Visit | Attending: Family Medicine | Admitting: Family Medicine

## 2011-11-02 ENCOUNTER — Ambulatory Visit (INDEPENDENT_AMBULATORY_CARE_PROVIDER_SITE_OTHER): Payer: BC Managed Care – PPO | Admitting: Family Medicine

## 2011-11-02 ENCOUNTER — Encounter: Payer: Self-pay | Admitting: Family Medicine

## 2011-11-02 VITALS — BP 128/83 | HR 80 | Temp 98.0°F | Ht 63.0 in | Wt 198.0 lb

## 2011-11-02 DIAGNOSIS — R509 Fever, unspecified: Secondary | ICD-10-CM

## 2011-11-02 DIAGNOSIS — R109 Unspecified abdominal pain: Secondary | ICD-10-CM

## 2011-11-02 DIAGNOSIS — R1031 Right lower quadrant pain: Secondary | ICD-10-CM

## 2011-11-02 LAB — POCT URINALYSIS DIPSTICK
Bilirubin, UA: NEGATIVE
Leukocytes, UA: NEGATIVE
Nitrite, UA: NEGATIVE
Protein, UA: NEGATIVE
Urobilinogen, UA: 0.2
pH, UA: 6.5

## 2011-11-02 MED ORDER — CIPROFLOXACIN HCL 500 MG PO TABS: 500.0000 mg | ORAL_TABLET | Freq: Two times a day (BID) | ORAL | Status: AC

## 2011-11-02 MED ORDER — ONDANSETRON 8 MG PO TBDP: 8.0000 mg | ORAL_TABLET | Freq: Three times a day (TID) | ORAL | Status: AC | PRN

## 2011-11-02 MED ORDER — IOHEXOL 300 MG/ML  SOLN
100.0000 mL | Freq: Once | INTRAMUSCULAR | Status: AC | PRN
  Administered 2011-11-02: 100 mL via INTRAVENOUS

## 2011-11-02 NOTE — Progress Notes (Signed)
Subjective:    Patient ID: Leah Wells, female    DOB: March 08, 1981, 31 y.o.   MRN: [id number]  Abdominal Pain This is a new (nausea for a while but today its worse) problem. The current episode started 1 to 4 weeks ago (fever off and on for 2 weeeks). The problem occurs intermittently. The most recent episode lasted 2 weeks. The problem has been gradually worsening (waxing and waning). The pain is located in the epigastric region. The pain is at a severity of 4/10. The pain is mild. The quality of the pain is cramping. The abdominal pain does not radiate. Associated symptoms include anorexia, arthralgias, belching, a fever, myalgias and weight loss. Pertinent negatives include no constipation, diarrhea, dysuria, flatus, frequency, headaches, hematochezia, hematuria, melena or vomiting. Associated symptoms comments: Yesterday w/a sRsore throat today is cleared. The pain is aggravated by nothing. The pain is relieved by belching. Treatments tried: rest. There is no history of abdominal surgery, colon cancer, Crohn's disease, gallstones, GERD, irritable bowel syndrome, pancreatitis, PUD or ulcerative colitis.      Review of Systems  Constitutional: Positive for fever and weight loss.  Gastrointestinal: Positive for abdominal pain and anorexia. Negative for vomiting, diarrhea, constipation, melena, hematochezia and flatus.  Genitourinary: Negative for dysuria, frequency and hematuria.  Musculoskeletal: Positive for myalgias and arthralgias.  Neurological: Negative for headaches.  All other systems reviewed and are negative.      BP 128/83  Pulse 80  Temp(Src) 98 F (36.7 C) (Oral)  Ht 5\' 3"  (1.6 m)  Wt 198 lb (89.812 kg)  BMI 35.07 kg/m2  SpO2 100% Objective:   Physical Exam  Constitutional: She is oriented to person, place, and time. She appears well-developed and well-nourished.  HENT:  Head: Normocephalic.  Cardiovascular: Normal rate and regular rhythm.  Exam reveals friction rub.    No murmur heard. Pulmonary/Chest: Effort normal and breath sounds normal.  Abdominal: Soft. Normal appearance. She exhibits no mass. There is hepatomegaly. There is tenderness in the right upper quadrant, right lower quadrant and left lower quadrant. There is no rebound, no guarding and no CVA tenderness.       Tenderness on rectal exam in the right abdominal area.  Genitourinary: Rectal exam shows tenderness. Rectal exam shows no fissure and no mass. Guaiac negative stool.  Musculoskeletal: Normal range of motion.  Neurological: She is alert and oriented to person, place, and time. No cranial nerve deficit.  Skin: Skin is warm and dry.  Psychiatric: She has a normal mood and affect.      Results for orders placed in visit on 11/02/11  POCT URINALYSIS DIPSTICK      Component Value Range   Color, UA yellow     Clarity, UA clear     Glucose, UA neg     Bilirubin, UA neg     Ketones, UA neg     Spec Grav, UA 1.020     Blood, UA neg     pH, UA 6.5     Protein, UA neg     Urobilinogen, UA 0.2     Nitrite, UA neg     Leukocytes, UA Negative    POCT URINE PREGNANCY      Component Value Range   Preg Test, Ur Negative    HEMOCCULT (POC) BLOOD/STOOL TEST (OFFICE-1 CARD)      Component Value Range   Fecal Occult Blood, POC Negative     Assessment & Plan:  Abdominal pain : Etiology not clear. With the  rectal exam reproducing lower right quadrant pain and fever we'll send her for CT of the abdomen to rule out appendicitis and diverticulitis. CT scan essentially negative we'll obtain ultrasound of gallbladder and pancreas. Return one week for followup. Work note through Saturday and Cipro 500 mg twice a day for one week.

## 2011-11-02 NOTE — Patient Instructions (Signed)

## 2011-11-03 ENCOUNTER — Ambulatory Visit
Admission: RE | Admit: 2011-11-03 | Discharge: 2011-11-03 | Disposition: A | Discharge status: Home / Self Care | Payer: BC Managed Care – PPO | Source: Ambulatory Visit | Attending: Family Medicine | Admitting: Family Medicine

## 2011-11-03 ENCOUNTER — Encounter: Payer: Self-pay | Admitting: Physician Assistant

## 2011-11-03 ENCOUNTER — Ambulatory Visit (INDEPENDENT_AMBULATORY_CARE_PROVIDER_SITE_OTHER): Payer: BC Managed Care – PPO | Admitting: Physician Assistant

## 2011-11-03 DIAGNOSIS — R509 Fever, unspecified: Secondary | ICD-10-CM

## 2011-11-03 DIAGNOSIS — R109 Unspecified abdominal pain: Secondary | ICD-10-CM

## 2011-11-03 DIAGNOSIS — R110 Nausea: Secondary | ICD-10-CM

## 2011-11-03 LAB — CBC WITH DIFFERENTIAL/PLATELET
Basophils Absolute: 0 10*3/uL (ref 0.0–0.1)
Eosinophils Absolute: 0.1 10*3/uL (ref 0.0–0.7)
Eosinophils Relative: 2 % (ref 0–5)
Lymphs Abs: 3.5 10*3/uL (ref 0.7–4.0)
MCH: 30.2 pg (ref 26.0–34.0)
MCV: 91.6 fL (ref 78.0–100.0)
Platelets: 331 10*3/uL (ref 150–400)
RDW: 11.9 % (ref 11.5–15.5)

## 2011-11-03 LAB — AMYLASE: Amylase: 39 U/L (ref 0–105)

## 2011-11-03 LAB — COMPLETE METABOLIC PANEL WITH GFR
ALT: 27 U/L (ref 0–35)
BUN: 12 mg/dL (ref 6–23)
CO2: 28 mEq/L (ref 19–32)
Creat: 0.73 mg/dL (ref 0.50–1.10)
GFR, Est African American: >89 mL/min
Total Bilirubin: 0.3 mg/dL (ref 0.3–1.2)

## 2011-11-03 LAB — LIPASE: Lipase: 35 U/L (ref 0–75)

## 2011-11-06 ENCOUNTER — Telehealth: Payer: Self-pay | Admitting: *Deleted

## 2011-11-06 MED ORDER — PROMETHAZINE HCL 25 MG PO TABS: 25.0000 mg | ORAL_TABLET | Freq: Four times a day (QID) | ORAL | Status: DC | PRN

## 2011-11-06 NOTE — Patient Instructions (Signed)
Given prescription for dexilant 60mg  once a day. Will call with lab results of h.pylori test.

## 2011-11-06 NOTE — Progress Notes (Signed)
  Subjective:    Patient ID: Leah Wells, female    DOB: 22-Jan-1981, 31 y.o.   MRN: [id number]  HPI Patient was seen in office yesterday by Dr. Thurmond Butts. Sent patient for a CT of her abdomen and a ultrasound of her RUQ.  Both were negative. CBC, amylase, lipase pregnancy test, UA, and CMP were normal.  Given Cipro to start and Zofran for nausea. She reports today because she is not any better. For 1-4 weeks she has had generalized abdominal pain and fever off and on. The pain is still a 4/10 and worse after she eats. She has not ran a fever since starting Cipro.   Review of Systems     Objective:   Physical Exam  Constitutional: She is oriented to person, place, and time. She appears well-developed and well-nourished.  HENT:  Head: Normocephalic and atraumatic.  Eyes:       NO jaundice.  Cardiovascular: Normal rate, regular rhythm and normal heart sounds.   Pulmonary/Chest: Effort normal and breath sounds normal. She has no wheezes.  Abdominal: Soft. Bowel sounds are normal. She exhibits no distension and no mass. There is no rebound and no guarding.       Diffuse tenderness in the RUQ, LLL, and RLQ. Mild hepatomegaly.  Neurological: She is alert and oriented to person, place, and time.  Psychiatric: She has a normal mood and affect. Her behavior is normal.          Assessment & Plan:  Abdominal pain/Nausea-start patient on Dexilant 60 mg once a day. For her to lab to get an H. pylori breath test. We'll call with results. Phenergan for the nausea to use as needed up to every 6 hours. Inform patient if she's not feeling significantly better or she starts to fall worse to give the office a call. We may need to consider referral to gastroenterology for endoscopy to evaluate for peptic ulcer.

## 2011-11-06 NOTE — Telephone Encounter (Signed)
Pt returning your call. Call her at work number if its before 4:00. Call [phone] after 4pm

## 2011-11-15 ENCOUNTER — Telehealth: Payer: Self-pay | Admitting: *Deleted

## 2011-11-15 NOTE — Telephone Encounter (Signed)
Pt states she is still having the nausea but states pain is a little better. She is asking about h pylori test results but I'm not seeing them in the chart. Please advise. States she was told on Friday that we would have results on Monday and then Monday states she was told it would take a week.

## 2011-11-15 NOTE — Telephone Encounter (Signed)
The day I gave her order our computers were down. I gave her a hand prescription. I do not see that it was ever done. Will you call lab and see if they have any record of drawing up the blood for it?

## 2011-11-16 NOTE — Telephone Encounter (Signed)
Called Lab and they are faxing over results.

## 2011-11-23 ENCOUNTER — Other Ambulatory Visit: Payer: Self-pay | Admitting: Physician Assistant

## 2011-11-23 ENCOUNTER — Encounter: Payer: Self-pay | Admitting: Family Medicine

## 2011-11-24 ENCOUNTER — Telehealth: Payer: Self-pay | Admitting: *Deleted

## 2011-11-24 DIAGNOSIS — R1084 Generalized abdominal pain: Secondary | ICD-10-CM

## 2011-11-24 NOTE — Telephone Encounter (Signed)
Will refer to GI for evaluation.

## 2011-11-24 NOTE — Telephone Encounter (Signed)
Pt is calling in regards to lab results. States she is not doing much better. States it is the breath test and one other. Please advise.

## 2011-11-30 ENCOUNTER — Telehealth: Payer: Self-pay | Admitting: *Deleted

## 2011-11-30 NOTE — Telephone Encounter (Signed)
Pt states the medication you sent to the pharmacy for that the insurance stated she had to do a step program so pt bought Prevacid OTC and states that it is not helping. She would like to know what else she can do.

## 2011-12-01 ENCOUNTER — Encounter: Payer: Self-pay | Admitting: Gastroenterology

## 2011-12-01 ENCOUNTER — Encounter: Payer: Self-pay | Admitting: Internal Medicine

## 2011-12-01 NOTE — Telephone Encounter (Signed)
Pt informed

## 2011-12-01 NOTE — Telephone Encounter (Signed)
I sent prior auth for nexium. Once it gets approved would like for her to start that. I have referred for GI. If you could check on status of that referral if patient has an appt yet.

## 2011-12-21 ENCOUNTER — Encounter: Payer: Self-pay | Admitting: Emergency Medicine

## 2011-12-21 ENCOUNTER — Emergency Department (INDEPENDENT_AMBULATORY_CARE_PROVIDER_SITE_OTHER)
Admission: EM | Admit: 2011-12-21 | Discharge: 2011-12-21 | Disposition: A | Discharge status: Home / Self Care | Payer: BC Managed Care – PPO | Source: Home / Self Care | Attending: Emergency Medicine | Admitting: Emergency Medicine

## 2011-12-21 DIAGNOSIS — J020 Streptococcal pharyngitis: Secondary | ICD-10-CM

## 2011-12-21 DIAGNOSIS — R112 Nausea with vomiting, unspecified: Secondary | ICD-10-CM

## 2011-12-21 LAB — POCT URINALYSIS DIP (MANUAL ENTRY)
Blood, UA: NEGATIVE
Glucose, UA: NEGATIVE
Spec Grav, UA: 1.02 (ref 1.005–1.03)
pH, UA: 8.5 (ref 5–8)

## 2011-12-21 LAB — POCT RAPID STREP A (OFFICE): Rapid Strep A Screen: POSITIVE — AB

## 2011-12-21 MED ORDER — PROMETHAZINE HCL 25 MG/ML IJ SOLN
25.0000 mg | Freq: Once | INTRAMUSCULAR | Status: AC
  Administered 2011-12-21: 25 mg via INTRAMUSCULAR

## 2011-12-21 MED ORDER — PENICILLIN G BENZATHINE 1200000 UNIT/2ML IM SUSP
1.2000 10*6.[IU] | Freq: Once | INTRAMUSCULAR | Status: AC
  Administered 2011-12-21: 1.2 10*6.[IU] via INTRAMUSCULAR

## 2011-12-21 MED ORDER — ONDANSETRON 4 MG PO TBDP: 4.0000 mg | ORAL_TABLET | Freq: Four times a day (QID) | ORAL | Status: AC | PRN

## 2011-12-21 NOTE — ED Notes (Signed)
Vomiting, sore throat, dysuria, runny nose since yesterday

## 2011-12-21 NOTE — ED Provider Notes (Signed)
History     CSN: [id number]  Arrival date & time 12/21/11  4098   First MD Initiated Contact with Patient 12/21/11 [phone]      Chief Complaint  Patient presents with  . Emesis    (Consider location/radiation/quality/duration/timing/severity/associated sxs/prior treatment) HPI Leah Wells is a 31 y.o. female who complains of onset of cold symptoms for 1 day.  The symptoms are constant and moderate in severity.  No known sick contacts.  She hasn't been using any medicines to help. + sore throat No cough No pleuritic pain No wheezing + nasal congestion + post-nasal drainage No sinus pain/pressure No chest congestion No itchy/red eyes No earache No hemoptysis No SOB No chills/sweats + fever + nausea + vomiting No abdominal pain No diarrhea + fatigue + myalgias + headache    History reviewed. No pertinent past medical history.  Past Surgical History  Procedure Date  . Cesarean section 12/09/2005    Family History  Problem Relation Age of Onset  . Breast cancer    . Lung cancer    . Thyroid cancer    . Heart attack    . Diabetes    . Hyperlipidemia    . Hypertension    . Stroke      History  Substance Use Topics  . Smoking status: Never Smoker   . Smokeless tobacco: Not on file  . Alcohol Use: No    OB History    Grav Para Term Preterm Abortions TAB SAB Ect Mult Living                  Review of Systems  All other systems reviewed and are negative.    Allergies  Dust mite extract  Home Medications   Current Outpatient Rx  Name Route Sig Dispense Refill  . FEXOFENADINE-PSEUDOEPHED ER 180-240 MG PO TB24 Oral Take 1 tablet by mouth daily. 30 tablet 1  . ONDANSETRON 4 MG PO TBDP Oral Take 1 tablet (4 mg total) by mouth every 6 (six) hours as needed for nausea. 20 tablet 0  . PREDNISONE 10 MG PO TABS  Take 2 tabs by mouth today, then two tabs twice daily for two days, then one tab twice daily for 2 days, then 1 daily for two days.  Take PC 16 tablet 0   . PROMETHAZINE HCL 25 MG PO TABS Oral Take 1 tablet (25 mg total) by mouth every 6 (six) hours as needed for nausea. 30 tablet 0  . TOPIRAMATE 100 MG PO TABS Oral Take 100 mg by mouth 2 (two) times daily.    . TRIAMCINOLONE ACETONIDE 0.1 % EX OINT Topical Apply topically 2 (two) times daily. Do not use longer than 2 weeks. 30 g 0    BP 131/85  Pulse 127  Temp 98.9 F (37.2 C) (Oral)  Resp 16  Ht 5\' 3"  (1.6 m)  Wt 196 lb (88.905 kg)  BMI 34.72 kg/m2  SpO2 95%  Physical Exam  Nursing note and vitals reviewed. Constitutional: She is oriented to person, place, and time. She appears well-developed and well-nourished.  Non-toxic appearance. She appears ill.  HENT:  Head: Normocephalic and atraumatic.  Right Ear: Tympanic membrane, external ear and ear canal normal.  Left Ear: Tympanic membrane, external ear and ear canal normal.  Nose: Mucosal edema and rhinorrhea present.  Mouth/Throat: Posterior oropharyngeal erythema present. No oropharyngeal exudate or posterior oropharyngeal edema.  Eyes: No scleral icterus.  Neck: Neck supple.  Cardiovascular: Regular rhythm and normal heart sounds.  Pulmonary/Chest: Effort normal and breath sounds normal. No respiratory distress.  Neurological: She is alert and oriented to person, place, and time.  Skin: Skin is warm and dry.  Psychiatric: She has a normal mood and affect. Her speech is normal.    ED Course  Procedures (including critical care time)  Labs Reviewed  POCT RAPID STREP A (OFFICE) - Abnormal; Notable for the following:    Rapid Strep A Screen Positive (*)     All other components within normal limits  POCT URINALYSIS DIP (MANUAL ENTRY)  POCT URINE PREGNANCY   No results found.   1. Strep pharyngitis   2. Nausea & vomiting       MDM  1)  We gave her a shot of Bicillin in clinic.  We also gave her a shot of Phenergan in clinic.  I then gave her prescription for Zofran ODT to take home.  Change her toothbrush in 24  hours.  She needs to push oral fluids or she will become dehydrated, as evident by her pulse.  As of now, I do not feel she requires IV fluids. 2)  Use nasal saline solution (over the counter) at least 3 times a day. 3)  Use over the counter decongestants like Zyrtec-D every 12 hours as needed to help with congestion.  If you have hypertension, do not take medicines with sudafed.  4)  Can take tylenol every 6 hours or motrin every 8 hours for pain or fever. 5)  Follow up with your primary doctor if no improvement in 5-7 days, sooner if increasing pain, fever, or new symptoms.           Marlaine Hind, MD 12/21/11 1041

## 2011-12-22 ENCOUNTER — Encounter: Payer: Self-pay | Admitting: Internal Medicine

## 2011-12-23 ENCOUNTER — Telehealth: Payer: Self-pay | Admitting: Family Medicine

## 2011-12-25 ENCOUNTER — Telehealth: Payer: Self-pay

## 2011-12-25 NOTE — ED Notes (Signed)
Leah Wells called and left a message for a return call. I returned her call and she stated she still had a sore throat and wanted to know what she could do about this. I advised her I would talk to Dr Orson Aloe about magic mouth wash. She then informed me she had spoken to someone over the weekend and a prescription for a lidocaine mouth wash was called in, but she has not picked it up from the pharmacy. I then advised her to pick up the prescription and take as directed for this will help her sore throat. After speaking with Shanda Bumps I phoned the pharmacy to confirm the prescription for the mouth wash was called in. [facility] did have the prescription and it is ready for pick up.

## 2011-12-26 ENCOUNTER — Ambulatory Visit: Payer: BC Managed Care – PPO | Admitting: Gastroenterology

## 2011-12-27 ENCOUNTER — Ambulatory Visit: Payer: BC Managed Care – PPO | Admitting: Internal Medicine

## 2012-01-08 ENCOUNTER — Telehealth: Payer: Self-pay | Admitting: Gastroenterology

## 2012-01-08 NOTE — Telephone Encounter (Signed)
Pt states she has had diarrhea since last Monday and is having bad abdominal pain. Pt scheduled to see Mike Gip PA tomorrow afternoon at 3:30pm. Pt aware of appt date and time.

## 2012-01-09 ENCOUNTER — Other Ambulatory Visit (INDEPENDENT_AMBULATORY_CARE_PROVIDER_SITE_OTHER): Payer: BC Managed Care – PPO

## 2012-01-09 ENCOUNTER — Encounter: Payer: Self-pay | Admitting: *Deleted

## 2012-01-09 ENCOUNTER — Ambulatory Visit (INDEPENDENT_AMBULATORY_CARE_PROVIDER_SITE_OTHER): Payer: BC Managed Care – PPO | Admitting: Physician Assistant

## 2012-01-09 VITALS — BP 118/80 | HR 76 | Ht 65.0 in | Wt 198.0 lb

## 2012-01-09 DIAGNOSIS — R197 Diarrhea, unspecified: Secondary | ICD-10-CM

## 2012-01-09 DIAGNOSIS — R109 Unspecified abdominal pain: Secondary | ICD-10-CM

## 2012-01-09 DIAGNOSIS — R1030 Lower abdominal pain, unspecified: Secondary | ICD-10-CM

## 2012-01-09 LAB — CBC WITH DIFFERENTIAL/PLATELET
Basophils Absolute: 0 10*3/uL (ref 0.0–0.1)
Basophils Relative: 0.5 % (ref 0.0–3.0)
Hemoglobin: 13.1 g/dL (ref 12.0–15.0)
Lymphocytes Relative: 37.5 % (ref 12.0–46.0)
Monocytes Relative: 5.6 % (ref 3.0–12.0)
Neutro Abs: 4.2 10*3/uL (ref 1.4–7.7)
RBC: 4.24 Mil/uL (ref 3.87–5.11)

## 2012-01-09 LAB — BASIC METABOLIC PANEL
BUN: 8 mg/dL (ref 6–23)
CO2: 30 mEq/L (ref 19–32)
Chloride: 101 mEq/L (ref 96–112)
Creatinine, Ser: 0.8 mg/dL (ref 0.4–1.2)

## 2012-01-09 MED ORDER — METRONIDAZOLE 250 MG PO TABS: ORAL_TABLET | ORAL | Status: DC

## 2012-01-09 MED ORDER — GLYCOPYRROLATE 2 MG PO TABS: 2.0000 mg | ORAL_TABLET | Freq: Two times a day (BID) | ORAL | Status: DC

## 2012-01-09 NOTE — Patient Instructions (Addendum)
Please go to the basement level to have your labs drawn.   We sent prescriptions to [facility], Kathryne Sharper for Flagyl and Robinul Forte. Stay on a Low Residue Diet for now. Brochure given.

## 2012-01-09 NOTE — Progress Notes (Signed)
Reviewed, agree with empirical Flagyl, follow up with You or me in 2 weeks.

## 2012-01-09 NOTE — Progress Notes (Signed)
Subjective:    Patient ID: Leah Wells, female    DOB: 12/31/1980, 31 y.o.   MRN: [id number]  HPI Nilani is a pleasant 31 year old white female, new to our practice today, referred by [place] primary care  [place]. She comes in today with complaints of diarrhea over the past 9 days. She had been ill with an episode of upper abdominal pain in May of 2013 and was given an empiric course of Cipro which she says she did not complete but did take for several days. As part of that workup she had CT scan of the abdomen and pelvis which was unremarkable. She also had upper of, ALT which was negative. She says that pain has resolved. She was then diagnosed with strep pharyngitis through the emergency room and was given a shot of by [place] and on 12/21/2011. She says she has not taken any other antibiotics since that time. She started with diarrhea on July 1 and says she's been having at least 7 or 8 loose to liquid bowel movements per day. She has not seen any melena or hematochezia does not think that the stool was particularly malodorous. She has not had any associated fever or chills. She is having lower abdominal pain and cramping which is more noticeable in the right lower quadrant. She has had some nausea but no vomiting. She says she feels okay in general, however she has not been needing during the day because she tends to have less diarrhea. She has tried Pepto-Bismol Kaopectate and a probiotic all without any benefit. Patient does have a paternal aunt with Crohn's disease in a grandmother who had a colon resection for reasons that are not clear however she did not have cancer.    Review of Systems  Constitutional: Negative.   HENT: Negative.   Eyes: Negative.   Respiratory: Negative.   Cardiovascular: Negative.   Gastrointestinal: Positive for nausea, abdominal pain and diarrhea.  Genitourinary: Negative.   Musculoskeletal: Negative.   Neurological: Negative.   Hematological: Negative.    Psychiatric/Behavioral: Negative.    Outpatient Encounter Prescriptions as of 01/09/2012  Medication Sig Dispense Refill  . fexofenadine-pseudoephedrine (ALLEGRA-D 24 HOUR) 180-240 MG per 24 hr tablet Take 1 tablet by mouth daily.  30 tablet  1  . topiramate (TOPAMAX) 100 MG tablet Take 100 mg by mouth 2 (two) times daily.      Marland Kitchen triamcinolone ointment (KENALOG) 0.1 % Apply topically 2 (two) times daily. Do not use longer than 2 weeks.  30 g  0  . glycopyrrolate (ROBINUL) 2 MG tablet Take 1 tablet (2 mg total) by mouth 2 (two) times daily.  60 tablet  1  . metroNIDAZOLE (FLAGYL) 250 MG tablet Take 1 tab 4 times a daily for 14 days.  56 tablet  0  . DISCONTD: predniSONE (DELTASONE) 10 MG tablet Take 2 tabs by mouth today, then two tabs twice daily for two days, then one tab twice daily for 2 days, then 1 daily for two days.  Take PC  16 tablet  0  . DISCONTD: promethazine (PHENERGAN) 25 MG tablet Take 1 tablet (25 mg total) by mouth every 6 (six) hours as needed for nausea.  30 tablet  0   Allergies  Allergen Reactions  . Dust Mite Extract    Patient Active Problem List  Diagnosis  . Migraine headache without aura  . Pernicious anemia  . Obesity  . LVH (left ventricular hypertrophy)  . GAD (generalized anxiety disorder)  . Fatigue  .  Arthralgia   History   Social History  . Marital Status: Married    Spouse Name: Ryne Deisher     Number of Children: 1  . Years of Education: GED   Occupational History  . customer service rep     [employer]   Social History Main Topics  . Smoking status: Former Smoker    Types: Cigarettes    Quit date: 07/03/2005  . Smokeless tobacco: Never Used  . Alcohol Use: No  . Drug Use: No  . Sexually Active: Yes -- Female partner(s)    Birth Control/ Protection: Injection     depo shot   Other Topics Concern  . Not on file   Social History Narrative   Daughter named Osborne Casco Drinks tea about 3-5       Objective:   Physical Exam   Well-developed young white female in no acute distress, pleasant blood pressure 118/80 pulse 76 height 5 foot 5 weight 198. HEENT; nontraumatic normocephalic EOMI PERRLA sclera anicteric,Neck; Supple no JVD, cardiovascular; regular rate and rhythm with S1-S2 no murmur or gallop, Pulmonary; clear bilaterally, Abdomen; soft she is tender bilaterally in the lower quadrants more so in the right lower quadrant there is no guarding no rebound no palpable mass or hepatosplenomegaly bowel sounds are active, Rectal; exam not done, Extremities; no clubbing cyanosis or edema skin warm and dry, Psych; mood and affect normal and appropriate       Assessment & Plan:  #1 31 yo female , generally in good health with history of persistent diarrhea and lower, crampy pain x9 days. Onset of symptoms after patient had taken to recent antibiotics and therefore question an antibiotic associated diarrhea or possible C. difficile diarrhea. Other possibility would be a post infectious IBS.  #2 chronic anxiety  Plan; check CBC, BMET, and CRP Stool for C. difficile PCR, stool culture, and stool for lactoferrin Start empiric Flagyl 250 mg by mouth 4 times daily x2 weeks Start Robinul Forte 2 mg twice daily as needed for cramping. Soft low-residue diet  Further plans pending results of above.

## 2012-01-10 ENCOUNTER — Other Ambulatory Visit: Payer: BC Managed Care – PPO

## 2012-01-10 ENCOUNTER — Telehealth: Payer: Self-pay | Admitting: Physician Assistant

## 2012-01-10 DIAGNOSIS — R197 Diarrhea, unspecified: Secondary | ICD-10-CM

## 2012-01-10 DIAGNOSIS — R1030 Lower abdominal pain, unspecified: Secondary | ICD-10-CM

## 2012-01-11 LAB — FECAL LACTOFERRIN, QUANT: Lactoferrin: POSITIVE

## 2012-01-11 NOTE — Telephone Encounter (Signed)
Spoke with pt and let her know all of her lab results are not back yet. Pt verbalized understanding.

## 2012-01-12 LAB — CLOSTRIDIUM DIFFICILE BY PCR: Toxigenic C. Difficile by PCR: NOT DETECTED

## 2012-01-14 LAB — STOOL CULTURE

## 2012-01-18 ENCOUNTER — Encounter: Payer: Self-pay | Admitting: *Deleted

## 2012-01-25 ENCOUNTER — Ambulatory Visit (INDEPENDENT_AMBULATORY_CARE_PROVIDER_SITE_OTHER): Payer: BC Managed Care – PPO | Admitting: Family Medicine

## 2012-01-25 ENCOUNTER — Encounter: Payer: Self-pay | Admitting: Family Medicine

## 2012-01-25 VITALS — BP 106/69 | HR 70 | Temp 97.7°F | Ht 65.0 in | Wt 196.0 lb

## 2012-01-25 DIAGNOSIS — F419 Anxiety disorder, unspecified: Secondary | ICD-10-CM | POA: Insufficient documentation

## 2012-01-25 DIAGNOSIS — H9209 Otalgia, unspecified ear: Secondary | ICD-10-CM

## 2012-01-25 DIAGNOSIS — H9202 Otalgia, left ear: Secondary | ICD-10-CM

## 2012-01-25 DIAGNOSIS — J029 Acute pharyngitis, unspecified: Secondary | ICD-10-CM

## 2012-01-25 DIAGNOSIS — F411 Generalized anxiety disorder: Secondary | ICD-10-CM

## 2012-01-25 LAB — POCT RAPID STREP A (OFFICE): Rapid Strep A Screen: NEGATIVE

## 2012-01-25 MED ORDER — ALPRAZOLAM 0.5 MG PO TABS: 0.5000 mg | ORAL_TABLET | Freq: Every day | ORAL | Status: DC | PRN

## 2012-01-25 NOTE — Progress Notes (Signed)
  Subjective:    Patient ID: Leah Wells, female    DOB: [birth year], 31 y.o.   MRN: [id number]  HPI Lambert Mody left  Ear pain. No fever or recent URI sxs. No drainage.  Took tome tylenol - helped some.  She used ot get swimmers ear a lot. She was treated for strep throat about 4 weeks ago and completed her antibiotic. She started getting a sore throat again yesterday.   Review of Systems     Objective:   Physical Exam  Constitutional: She is oriented to person, place, and time. She appears well-developed and well-nourished.  HENT:  Head: Normocephalic and atraumatic.  Right Ear: External ear normal.  Left Ear: External ear normal.  Nose: Nose normal.  Mouth/Throat: Oropharynx is clear and moist.       TMs and canals are clear.   Eyes: Conjunctivae and EOM are normal. Pupils are equal, round, and reactive to light.  Neck: Neck supple. No thyromegaly present.       Mildy tender left cervical LN.   Pulmonary/Chest: She has no wheezes.  Neurological: She is alert and oriented to person, place, and time.  Skin: Skin is warm and dry.  Psychiatric: She has a normal mood and affect.          Assessment & Plan:  Left ear pain - year exam itself is normal. No sign of internal or external ear infection. She had a normal tympanogram for both the ears. As early infection is less likely. Because she has had a mild sore throat since yesterday we will repeat her rapid strep. Rapid strep is negative. For pain control recommend alternating Tylenol and ibuprofen. I did give her 100 mg ibuprofen here since she has a straight back to work. She's to call if she notices any drainage from the ear, fever or worsening of her symptoms.  Anxiety-she has an old prescription from 2 years ago for alprazolam  0.25 mg. She would like a refill. Reck she still has some tablets left in the bottle. She says she uses it very sparingly. She does have a prior history of generalized anxiety disorder.

## 2012-01-25 NOTE — Patient Instructions (Addendum)
You can alternate Tylenol and ibuprofen. Take 800 mg of ibuprofen and then 4 hours take 2 extra strength Tylenol and then repeat ibuprofen 4 hours later.

## 2012-01-31 ENCOUNTER — Telehealth: Payer: Self-pay | Admitting: Family Medicine

## 2012-01-31 DIAGNOSIS — H9209 Otalgia, unspecified ear: Secondary | ICD-10-CM

## 2012-01-31 NOTE — Telephone Encounter (Signed)
OK, referral placed

## 2012-01-31 NOTE — Telephone Encounter (Signed)
Patient called request to know if she can have a referral to Ears, Nose and Throat Dr. She says her ear has not gotten any better from previous visit.

## 2012-02-02 ENCOUNTER — Ambulatory Visit: Payer: BC Managed Care – PPO | Admitting: Internal Medicine

## 2012-02-06 ENCOUNTER — Encounter: Payer: Self-pay | Admitting: Family Medicine

## 2012-04-16 ENCOUNTER — Encounter: Payer: Self-pay | Admitting: Family Medicine

## 2012-04-16 ENCOUNTER — Ambulatory Visit (INDEPENDENT_AMBULATORY_CARE_PROVIDER_SITE_OTHER): Payer: BC Managed Care – PPO | Admitting: Family Medicine

## 2012-04-16 VITALS — BP 124/74 | HR 75 | Temp 98.0°F | Ht 65.0 in | Wt 191.0 lb

## 2012-04-16 DIAGNOSIS — J029 Acute pharyngitis, unspecified: Secondary | ICD-10-CM

## 2012-04-16 DIAGNOSIS — J020 Streptococcal pharyngitis: Secondary | ICD-10-CM

## 2012-04-16 LAB — POCT RAPID STREP A (OFFICE): Rapid Strep A Screen: NEGATIVE

## 2012-04-16 MED ORDER — PENICILLIN V POTASSIUM 500 MG PO TABS: ORAL_TABLET | ORAL | Status: AC

## 2012-04-16 NOTE — Progress Notes (Signed)
CC: Leah Wells is a 31 y.o. female is here for Sore Throat   Subjective: HPI:  Woke Sunday morning with a sore throat is getting worse on a daily basis. Associated with bleeding from the tonsils this morning. She complains of subjective fever as well as fatigue. No interventions as of yet. Denies headache, motor sensory disturbances, ear pain, hearing trouble, eye pain/discharge, nasal discharge, pain with movement of the neck, chest pain, shortness of breath, nor wheezing. Denies change in her voice.   Review Of Systems Outlined In HPI  Past Medical History  Diagnosis Date  . Neuropathy     Legs and arma  . Chronic anxiety      Family History  Problem Relation Age of Onset  . Breast cancer Maternal Aunt   . Lung cancer Paternal Grandfather   . Thyroid cancer Sister   . Heart attack Maternal Grandmother     both sides  . Diabetes Maternal Grandmother   . Hyperlipidemia Mother   . Hypertension Mother   . Stroke Maternal Grandmother   . Crohn's disease Paternal Aunt   . Hyperlipidemia Father   . Hyperlipidemia Maternal Grandmother   . Hyperlipidemia Paternal Grandmother   . Hypertension Maternal Aunt   . Hypertension Maternal Grandmother      History  Substance Use Topics  . Smoking status: Former Smoker    Types: Cigarettes    Quit date: 07/03/2005  . Smokeless tobacco: Never Used  . Alcohol Use: No     Objective: Filed Vitals:   04/16/12 1039  BP: 124/74  Pulse: 75  Temp: 98 F (36.7 C)    General: Alert and Oriented, No Acute Distress HEENT: Pupils equal, round, reactive to light. Conjunctivae clear.  External ears unremarkable, canals clear with intact TMs with appropriate landmarks.  Middle ear appears open without effusion. Pink inferior turbinates.  Moist mucous membranes, pharynx without inflammation  however white plaques present on tonsils, no active bleeding or signs of remote bleeding.  Neck supple without palpable lymphadenopathy nor abnormal  masses. Lungs: Clear to auscultation bilaterally, no wheezing/ronchi/rales.  Comfortable work of breathing. Good air movement. Cardiac: Regular rate and rhythm. Normal S1/S2.  No murmurs, rubs, nor gallops.   Mental Status: No depression, anxiety, nor agitation. Skin: Warm and dry.  Assessment & Plan: Crystina was seen today for sore throat.  Diagnoses and associated orders for this visit:  Pharyngitis - POCT rapid strep A  Strep pharyngitis - penicillin v potassium (VEETID) 500 MG tablet; One by mouth every 12 hours for ten days, take 1 hour before or 2 hours after meals.    Negative rapid strep however clinical suspicion still moderate, will start penicillin. Signs and symptoms of mononucleosis discussed with patient should she not improve next couple days I've asked her to return. Symptomatically care with ibuprofen and saltwater gargles.  Return if symptoms worsen or fail to improve.

## 2012-07-22 ENCOUNTER — Telehealth: Payer: Self-pay | Admitting: *Deleted

## 2012-07-22 NOTE — Telephone Encounter (Signed)
If doesn't have money to come in then recommend not order labs either and just avoid dairy product for a month. If feels much better then likey has lactose intolerance. Test isn't 100% sensitive but if still really wants it ok to order Milk allergy tex.  Use dx of diarrhea.

## 2012-07-22 NOTE — Telephone Encounter (Signed)
Pt calls and states she doesn't really have the money to come in to be seen but wants to know if she can get labs done to test for lactose intolerance? States everytime eats or drinks dairy products gets abdominal cramping and diarrhea. Please advise

## 2012-07-23 NOTE — Telephone Encounter (Signed)
Pt notified.  Will avoid dairy for a month to see if that helps.

## 2012-08-20 ENCOUNTER — Emergency Department (INDEPENDENT_AMBULATORY_CARE_PROVIDER_SITE_OTHER)
Admission: EM | Admit: 2012-08-20 | Discharge: 2012-08-20 | Disposition: A | Discharge status: Home / Self Care | Payer: BC Managed Care – PPO | Source: Home / Self Care | Attending: Family Medicine | Admitting: Family Medicine

## 2012-08-20 ENCOUNTER — Encounter: Payer: Self-pay | Admitting: *Deleted

## 2012-08-20 DIAGNOSIS — H109 Unspecified conjunctivitis: Secondary | ICD-10-CM

## 2012-08-20 MED ORDER — POLYMYXIN B-TRIMETHOPRIM 10000-0.1 UNIT/ML-% OP SOLN: 1.0000 [drp] | OPHTHALMIC | Status: AC

## 2012-08-20 NOTE — ED Notes (Signed)
Pt c/o LT eye redness, itching and green/brown drainage x 1wk. She also c/o the same s/s in her RT eye x today. Denies fever. She has had a recent URI. No OTC meds.

## 2012-08-20 NOTE — ED Provider Notes (Signed)
History     CSN: [id number]  Arrival date & time 08/20/12  0840   First MD Initiated Contact with Patient 08/20/12 [phone]      Chief Complaint  Patient presents with  . Eye Problem   Patient is a 32 y.o. female presenting with eye problem.  Eye Problem   Eye redness x 1 week Predominantly in L eye.  Has had eye irritation, crusting, mild drainage.  Sxs have begun to appear in R eye since yesterday.  Pt does have some eye pain and blurred vision per pt. Pt does not wear contacts.  No headache.   Past Medical History  Diagnosis Date  . Neuropathy     Legs and arma  . Chronic anxiety     Past Surgical History  Procedure Laterality Date  . Cesarean section  12/09/2005  . Tonsillectomy      Family History  Problem Relation Age of Onset  . Breast cancer Maternal Aunt   . Lung cancer Paternal Grandfather   . Thyroid cancer Sister   . Heart attack Maternal Grandmother     both sides  . Diabetes Maternal Grandmother   . Hyperlipidemia Mother   . Hypertension Mother   . Stroke Maternal Grandmother   . Crohn's disease Paternal Aunt   . Hyperlipidemia Father   . Hyperlipidemia Maternal Grandmother   . Hyperlipidemia Paternal Grandmother   . Hypertension Maternal Aunt   . Hypertension Maternal Grandmother     History  Substance Use Topics  . Smoking status: Former Smoker    Types: Cigarettes    Quit date: 07/03/2005  . Smokeless tobacco: Never Used  . Alcohol Use: Yes     Comment: social    OB History   Grav Para Term Preterm Abortions TAB SAB Ect Mult Living                  Review of Systems  All other systems reviewed and are negative.    Allergies  Dust mite extract  Home Medications   Current Outpatient Rx  Name  Route  Sig  Dispense  Refill  . glycopyrrolate (ROBINUL) 2 MG tablet   Oral   Take 1 tablet (2 mg total) by mouth 2 (two) times daily.   60 tablet   1   . trimethoprim-polymyxin b (POLYTRIM) ophthalmic solution   Both Eyes  Place 1 drop into both eyes every 4 (four) hours.   10 mL   0     BP 116/76  Pulse 74  Temp(Src) 97.8 F (36.6 C) (Oral)  Ht 5' 3.5" (1.613 m)  Wt 196 lb (88.905 kg)  BMI 34.17 kg/m2  SpO2 100%  Physical Exam  Constitutional: She appears well-developed.  HENT:  Head: Normocephalic and atraumatic.  Right Ear: External ear normal.  Left Ear: External ear normal.  Eyes: EOM are normal. Pupils are equal, round, and reactive to light.  Mild conjunctivitis bilaterally  Mild peripheral crusting L >R   Neck: Normal range of motion. Neck supple.  Cardiovascular: Normal rate and regular rhythm.   Pulmonary/Chest: Effort normal and breath sounds normal.  Abdominal: Soft.  Musculoskeletal: Normal range of motion.  Neurological: She is alert.  Skin: Skin is warm.    ED Course  Procedures (including critical care time)  Labs Reviewed - No data to display No results found.   1. Conjunctivitis       MDM  Flourescein eye exam WNL. No corneal abrasions.  Suspect viral etiology, but will  place on polytrim for infectious coverage.  Discussed infectious and ophtho red flags.  Follow up with ophtho if sxs worsen.     The patient and/or caregiver has been counseled thoroughly with regard to treatment plan and/or medications prescribed including dosage, schedule, interactions, rationale for use, and possible side effects and they verbalize understanding. Diagnoses and expected course of recovery discussed and will return if not improved as expected or if the condition worsens. Patient and/or caregiver verbalized understanding.             Doree Albee, MD 08/20/12 [phone]

## 2013-08-08 IMAGING — CT CT ABD-PELV W/ CM
2 of 4 series · 17 of 46 positions shown, 19 images · IV contrast (omnipaque)
Comparison: None.

CLINICAL DATA: Left-sided abdominal pain.

CT ABDOMEN AND PELVIS WITH CONTRAST
TECHNIQUE: Multidetector CT imaging of the abdomen and pelvis was
performed following the standard protocol during bolus
administration of intravenous contrast.
Contrast: 100mL OMNIPAQUE IOHEXOL 300 MG/ML  SOLN

[Series 2: abd/pelvis with · axial · 0.88mm/px · z∈[-326,+39]mm · 14 of 80 slices shown, 16 images]
[im 4/80  soft-tissue]
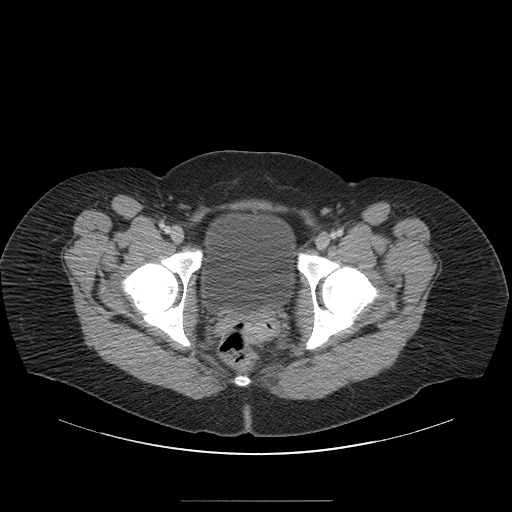
[im 4/80  bone]
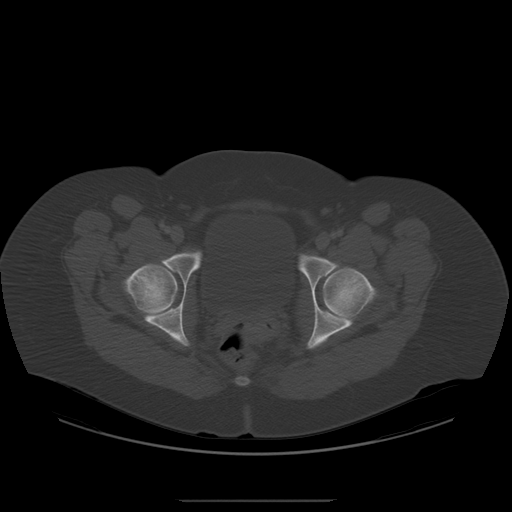
[im 10/80  soft-tissue]
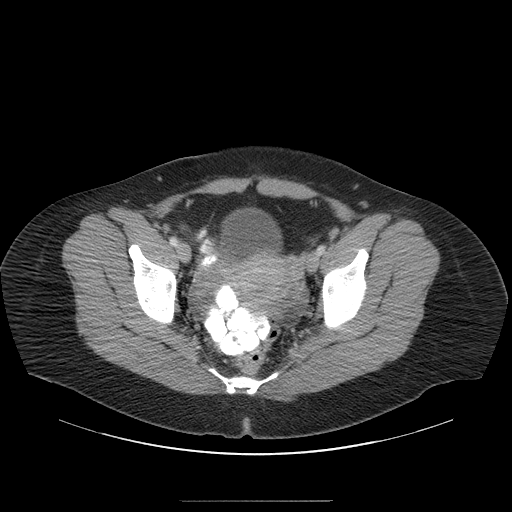
[im 16/80  soft-tissue]
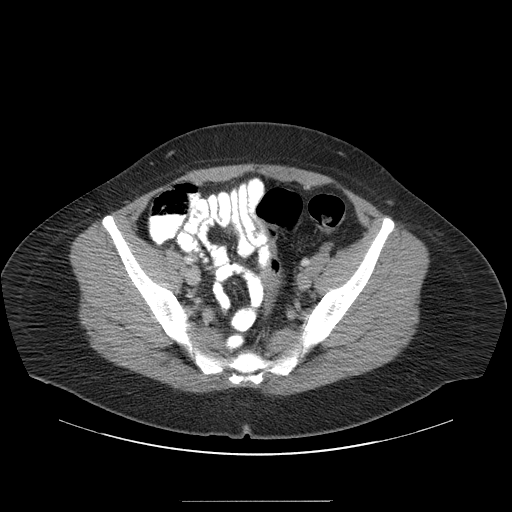
[im 23/80  soft-tissue]
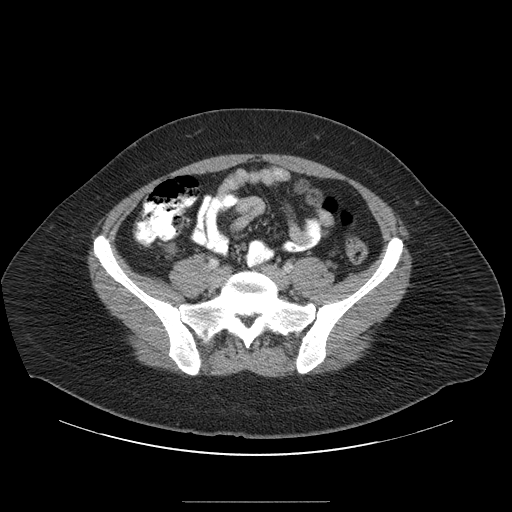
[im 26/80  soft-tissue]
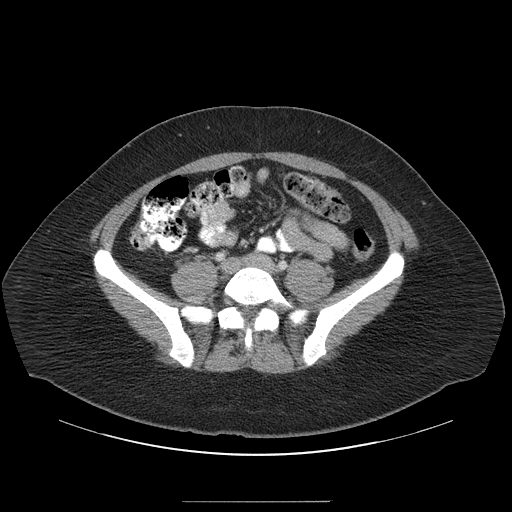
[im 32/80  soft-tissue]
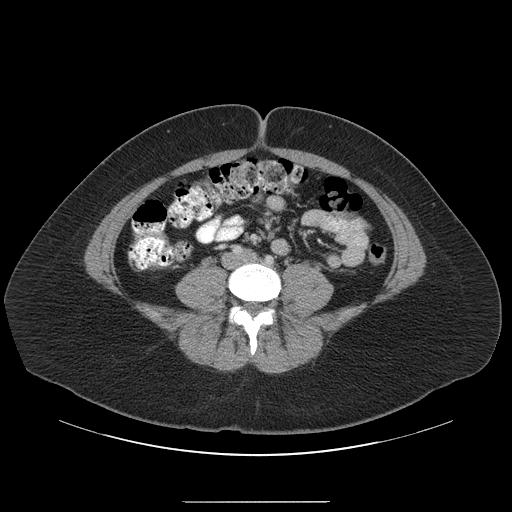
[im 38/80  soft-tissue]
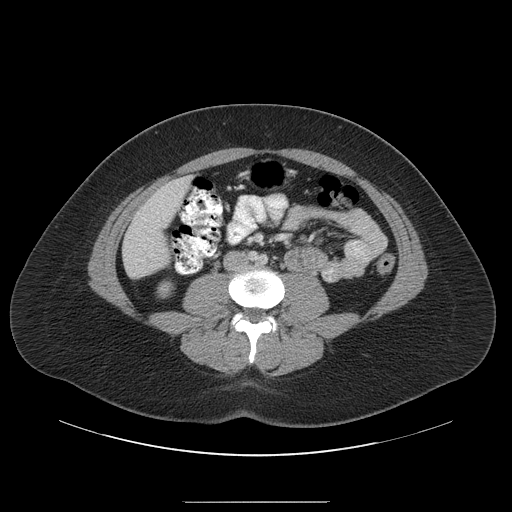
[im 42/80  soft-tissue]
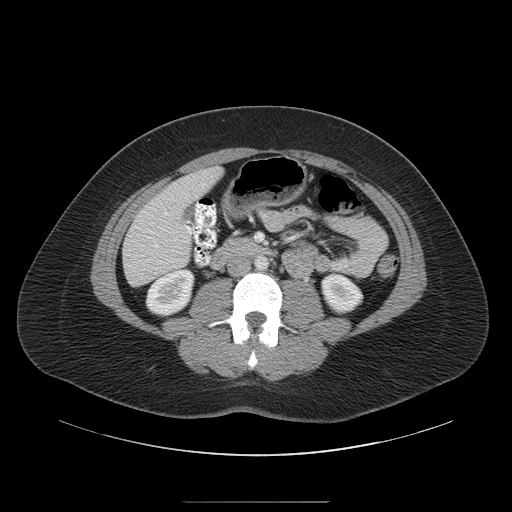
[im 48/80  soft-tissue]
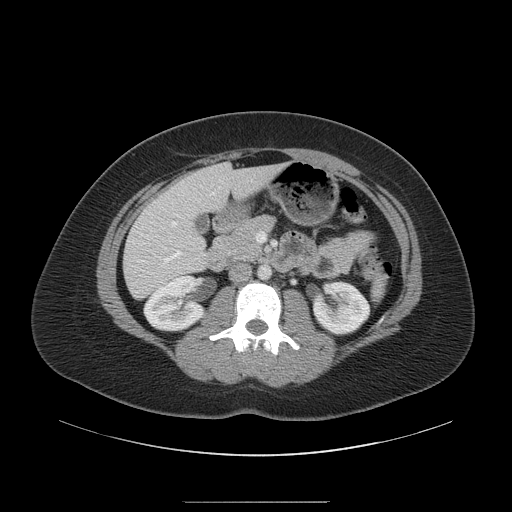
[im 48/80  bone]
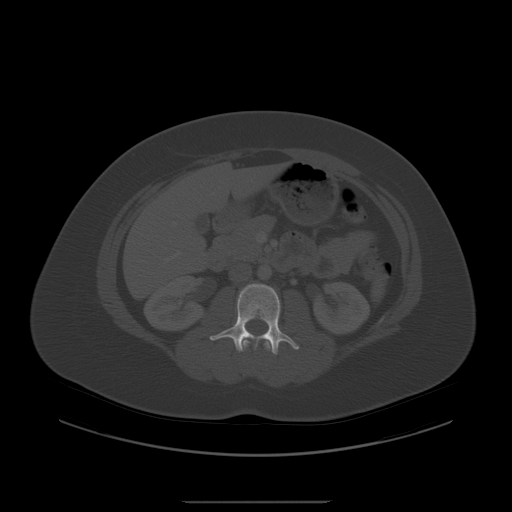
[im 54/80  soft-tissue]
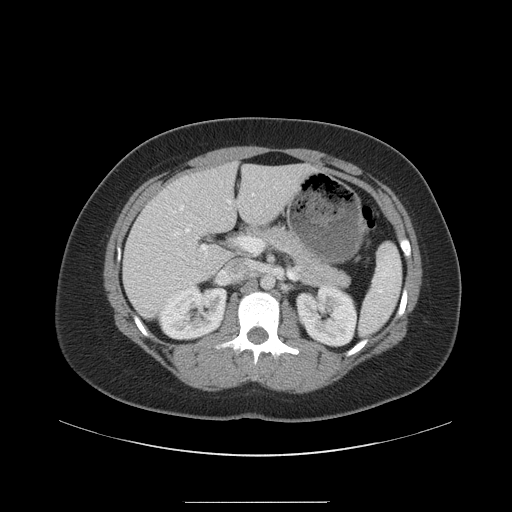
[im 61/80  soft-tissue]
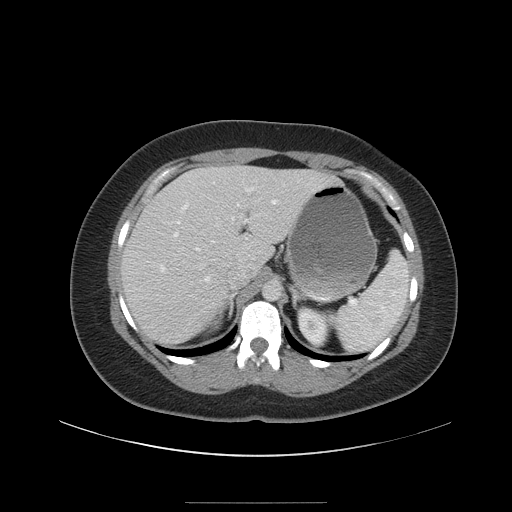
[im 64/80  soft-tissue]
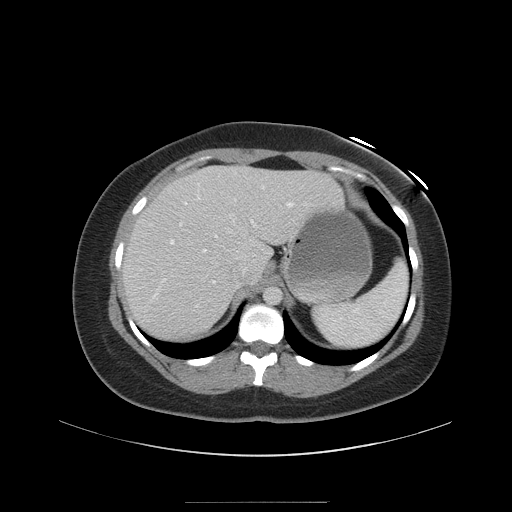
[im 70/80  soft-tissue]
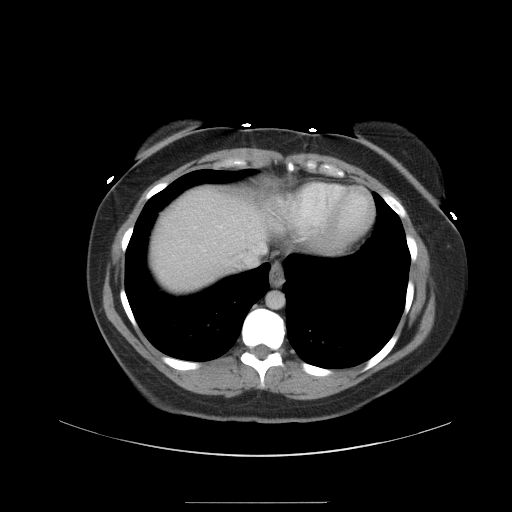
[im 76/80  soft-tissue]
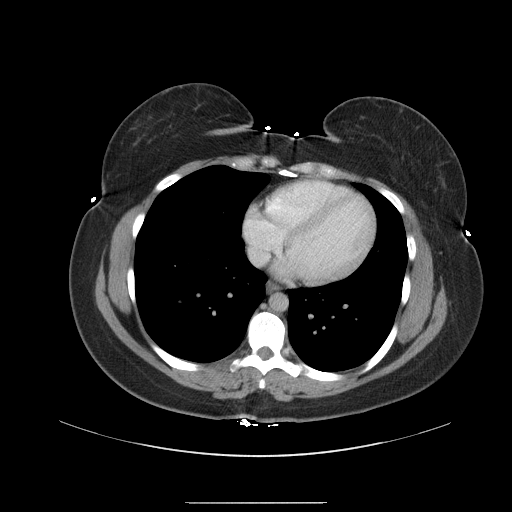

[Series 400: coronal · coronal · 0.92mm/px · 3 of 122 slices shown]
[im 41/122  soft-tissue]
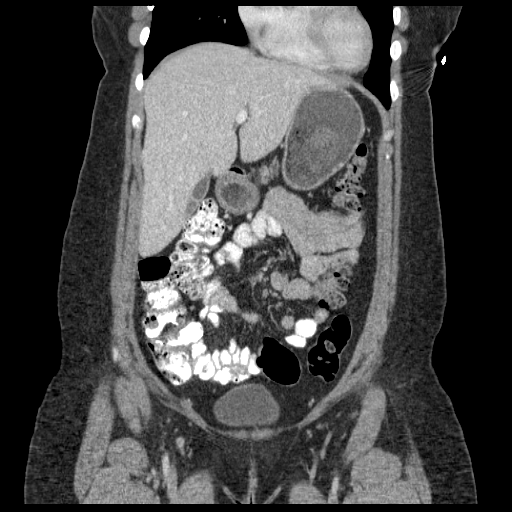
[im 54/122  soft-tissue]
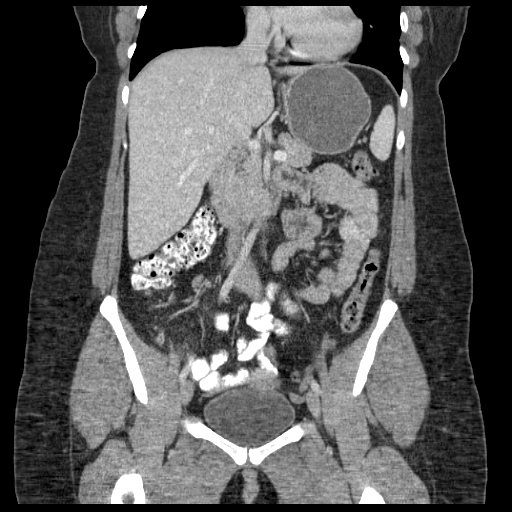
[im 68/122  soft-tissue]
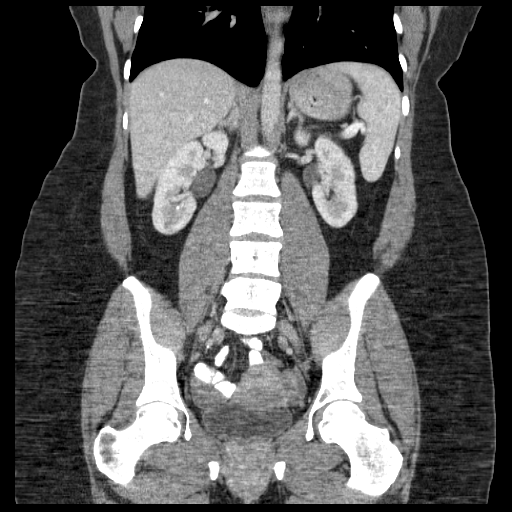

[17 of 46 positions shown; findings below may reference images not displayed]

FINDINGS: The lung bases are clear.

The solid abdominal organs are normal. A small lower pole right
renal calculus is noted.  No obstructing ureteral calculi.  The
gallbladder is normal.  No common bile duct dilatation.

The stomach, duodenum, small bowel and colon are unremarkable.  No
inflammatory changes or mass lesions.  No obstruction.  The
appendix is normal.  The uterus and ovaries are normal.  The
bladder is normal.  No abdominal or pelvic mass lesions or
adenopathy.  No free fluid collections.  The aorta is normal in
caliber.  The major branch vessels are normal.  The portal and
splenic veins are patent.

No mesenteric or retroperitoneal mass or adenopathy.  Small
scattered mesenteric lymph nodes are noted.

The bony structures are intact.
IMPRESSION: No acute abdominal/pelvic findings, mass lesions or adenopathy.

## 2013-12-12 ENCOUNTER — Encounter: Payer: Self-pay | Admitting: Family Medicine

## 2013-12-12 ENCOUNTER — Ambulatory Visit (INDEPENDENT_AMBULATORY_CARE_PROVIDER_SITE_OTHER): Payer: Managed Care, Other (non HMO) | Admitting: Family Medicine

## 2013-12-12 VITALS — BP 107/68 | HR 71 | Ht 63.0 in | Wt 216.0 lb

## 2013-12-12 DIAGNOSIS — Q828 Other specified congenital malformations of skin: Secondary | ICD-10-CM

## 2013-12-12 DIAGNOSIS — L858 Other specified epidermal thickening: Secondary | ICD-10-CM

## 2013-12-12 DIAGNOSIS — R10811 Right upper quadrant abdominal tenderness: Secondary | ICD-10-CM

## 2013-12-12 DIAGNOSIS — E669 Obesity, unspecified: Secondary | ICD-10-CM

## 2013-12-12 DIAGNOSIS — Z0000 Encounter for general adult medical examination without abnormal findings: Secondary | ICD-10-CM

## 2013-12-12 NOTE — Progress Notes (Signed)
Subjective:     Leah Wells is a 33 y.o. female and is here for a comprehensive physical exam. The patient reports problems - had one episdoes of rectal bleeding. Says has had spots occ of blood after BM but this time was a large amount. it was not painful. aunt with crohns disease. Had had problems with constipation mos to her adult life.  No fever or chills.  No thin stools.  No chest pain or palpitations.  Aslo has rash on both upper arms.  Now seems to be spreadin to her lower arms.    History   Social History  . Marital Status: Married    Spouse Name: Ryne Safi     Number of Children: 1  . Years of Education: GED   Occupational History  . customer service rep     [employer]   Social History Main Topics  . Smoking status: Former Smoker    Types: Cigarettes    Quit date: 07/03/2005  . Smokeless tobacco: Never Used  . Alcohol Use: Yes     Comment: social  . Drug Use: No  . Sexual Activity: Yes    Partners: Male    Birth Control/ Protection: Injection     Comment: depo shot   Other Topics Concern  . Not on file   Social History Narrative   Daughter named Osborne Cascoadia    Drinks tea about 3-5   Health Maintenance  Topic Date Due  . Pap Smear  06/14/2001  . Tetanus/tdap  06/14/2002  . Influenza Vaccine  01/31/2014    The following portions of the patient's history were reviewed and updated as appropriate: allergies, current medications, past family history, past medical history, past social history, past surgical history and problem list.  Review of Systems A comprehensive review of systems was negative.   Objective:    There were no vitals taken for this visit. General appearance: alert, cooperative and appears stated age Head: Normocephalic, without obvious abnormality, atraumatic Eyes: conj clear, EOMi, PEERLA Ears: normal TM's and external ear canals both ears Nose: Nares normal. Septum midline. Mucosa normal. No drainage or sinus tenderness. Throat:  lips, mucosa, and tongue normal; teeth and gums normal Neck: no adenopathy, no carotid bruit, no JVD, supple, symmetrical, trachea midline and thyroid not enlarged, symmetric, no tenderness/mass/nodules Back: symmetric, no curvature. ROM normal. No CVA tenderness. Lungs: clear to auscultation bilaterally Heart: regular rate and rhythm, S1, S2 normal, no murmur, click, rub or gallop Abdomen: soft, non-tender; bowel sounds normal; no masses,  no organomegaly Extremities: extremities normal, atraumatic, no cyanosis or edema Pulses: 2+ and symmetric Skin: Skin color, texture, turgor normal. No rashes or lesions Lymph nodes: Cervical, supraclavicular, and axillary nodes normal. Neurologic: Alert and oriented X 3, normal strength and tone. Normal symmetric reflexes. Normal coordination and gait    Assessment:    Healthy female exam.      Plan:     See After Visit Summary for Counseling Recommendations  Keep up a regular exercise program and make sure you are eating a healthy diet Try to eat 4 servings of dairy a day, or if you are lactose intolerant take a calcium with vitamin D daily.  Your vaccines are up to date.    Blood after BM - gave reassurance. She has not had any abdominal pain or fevers with it. Certainly if happens again please let me go. Discussed importance of getting a good bowel regimen to help have daily bowel movements that are soft  without having to strain.  Keratosis pilaris-virtually I was unable to find out whether not several of the topical choices such as the paralytics are good options while lactating. We'll stick with hydrating the skin for now.  Right upper quadrant tenderness with some nausea after meals. No vomiting. Recommend schedule for ultrasound for further evaluation.  Abnormal weight gain-after having her baby she initially lost a significant amount of weight but started again back recently despite continuing to breast-feed. She does have a sister who had  thyroid cancer and would like to have her thyroid evaluated.

## 2013-12-12 NOTE — Patient Instructions (Signed)
Keep up a regular exercise program and make sure you are eating a healthy diet Try to eat 4 servings of dairy a day, or if you are lactose intolerant take a calcium with vitamin D daily.  Your vaccines are up to date.   

## 2013-12-18 ENCOUNTER — Ambulatory Visit (INDEPENDENT_AMBULATORY_CARE_PROVIDER_SITE_OTHER): Payer: Managed Care, Other (non HMO)

## 2013-12-18 DIAGNOSIS — R110 Nausea: Secondary | ICD-10-CM

## 2013-12-18 DIAGNOSIS — R10811 Right upper quadrant abdominal tenderness: Secondary | ICD-10-CM

## 2013-12-18 LAB — CBC WITH DIFFERENTIAL/PLATELET
Basophils Absolute: 0.1 10*3/uL (ref 0.0–0.1)
Basophils Relative: 1 % (ref 0–1)
Eosinophils Absolute: 0.1 10*3/uL (ref 0.0–0.7)
Eosinophils Relative: 2 % (ref 0–5)
HCT: 37.9 % (ref 36.0–46.0)
HEMOGLOBIN: 12.9 g/dL (ref 12.0–15.0)
LYMPHS ABS: 2.3 10*3/uL (ref 0.7–4.0)
Lymphocytes Relative: 33 % (ref 12–46)
MCH: 30.1 pg (ref 26.0–34.0)
MCHC: 34 g/dL (ref 30.0–36.0)
MCV: 88.6 fL (ref 78.0–100.0)
MONOS PCT: 6 % (ref 3–12)
Monocytes Absolute: 0.4 10*3/uL (ref 0.1–1.0)
NEUTROS ABS: 4.1 10*3/uL (ref 1.7–7.7)
Neutrophils Relative %: 58 % (ref 43–77)
Platelets: 347 10*3/uL (ref 150–400)
RBC: 4.28 MIL/uL (ref 3.87–5.11)
RDW: 13.6 % (ref 11.5–15.5)
WBC: 7 10*3/uL (ref 4.0–10.5)

## 2013-12-19 LAB — COMPLETE METABOLIC PANEL WITH GFR
ALBUMIN: 4.6 g/dL (ref 3.5–5.2)
ALT: 28 U/L (ref 0–35)
AST: 23 U/L (ref 0–37)
Alkaline Phosphatase: 108 U/L (ref 39–117)
BUN: 15 mg/dL (ref 6–23)
CALCIUM: 9.3 mg/dL (ref 8.4–10.5)
CHLORIDE: 105 meq/L (ref 96–112)
CO2: 23 mEq/L (ref 19–32)
Creat: 0.76 mg/dL (ref 0.50–1.10)
GFR, Est African American: >89 mL/min
GLUCOSE: 84 mg/dL (ref 70–99)
POTASSIUM: 4.5 meq/L (ref 3.5–5.3)
SODIUM: 140 meq/L (ref 135–145)
TOTAL PROTEIN: 7.5 g/dL (ref 6.0–8.3)
Total Bilirubin: 0.4 mg/dL (ref 0.2–1.2)

## 2013-12-19 LAB — TSH: TSH: 1.945 u[IU]/mL (ref 0.350–4.500)

## 2013-12-19 LAB — LIPID PANEL
CHOL/HDL RATIO: 4.3 ratio
CHOLESTEROL: 191 mg/dL (ref 0–200)
HDL: 44 mg/dL (ref >39)
LDL Cholesterol: 127 mg/dL — ABNORMAL HIGH (ref 0–99)
Triglycerides: 99 mg/dL (ref <150)
VLDL: 20 mg/dL (ref 0–40)

## 2013-12-22 ENCOUNTER — Telehealth: Payer: Self-pay | Admitting: *Deleted

## 2013-12-22 NOTE — Telephone Encounter (Signed)
Results/recommendations given to patient.Leah Wells, Leah Wells  

## 2014-07-31 ENCOUNTER — Other Ambulatory Visit: Payer: Self-pay | Admitting: *Deleted

## 2014-07-31 MED ORDER — VALACYCLOVIR HCL 1 G PO TABS: 1000.0000 mg | ORAL_TABLET | Freq: Two times a day (BID) | ORAL | Status: DC

## 2014-07-31 MED ORDER — HYDROCORTISONE ACETATE-UREA 1-10 % EX CREA: TOPICAL_CREAM | Freq: Two times a day (BID) | CUTANEOUS | Status: DC

## 2014-07-31 NOTE — Telephone Encounter (Signed)
Pt was here for her CPE and because she was breast feeding at the time Dr. Linford ArnoldMetheney was going to check on a topical med for her to use for Ketatosis pilaris. She is no longer breast feeding and would like for something to be sent to [facility] on [address] Also she is wanting to know if Dr. Linford ArnoldMetheney would write the rx for valtrex 1 gm 1 tab po bid #30.Leah PacasBarkley, Leah Wells

## 2014-07-31 NOTE — Telephone Encounter (Signed)
rx sent

## 2014-08-03 NOTE — Telephone Encounter (Signed)
Ok to serpate the Rx.

## 2014-08-03 NOTE — Telephone Encounter (Signed)
[facility] called and states they can not get the Carmol-HCT. They can give her the medication if written separate - hydrocortisone 1 % and another for urea 10 %. Please advise. Ready to sign.

## 2014-08-04 MED ORDER — UREA 10 % EX CREA: TOPICAL_CREAM | CUTANEOUS | Status: DC | PRN

## 2014-08-04 MED ORDER — HYDROCORTISONE 1 % EX LOTN: 1.0000 "application " | TOPICAL_LOTION | Freq: Two times a day (BID) | CUTANEOUS | Status: DC

## 2015-03-16 ENCOUNTER — Telehealth: Payer: Self-pay | Admitting: *Deleted

## 2015-03-31 ENCOUNTER — Encounter: Payer: Self-pay | Admitting: Family Medicine

## 2015-03-31 ENCOUNTER — Ambulatory Visit (INDEPENDENT_AMBULATORY_CARE_PROVIDER_SITE_OTHER): Payer: Managed Care, Other (non HMO) | Admitting: Family Medicine

## 2015-03-31 ENCOUNTER — Other Ambulatory Visit (HOSPITAL_COMMUNITY)
Admission: RE | Admit: 2015-03-31 | Discharge: 2015-03-31 | Disposition: A | Discharge status: Home / Self Care | Payer: Managed Care, Other (non HMO) | Source: Ambulatory Visit | Attending: Family Medicine | Admitting: Family Medicine

## 2015-03-31 VITALS — BP 118/66 | HR 62 | Temp 98.7°F | Wt 164.0 lb

## 2015-03-31 DIAGNOSIS — Z1151 Encounter for screening for human papillomavirus (HPV): Secondary | ICD-10-CM | POA: Insufficient documentation

## 2015-03-31 DIAGNOSIS — Z0000 Encounter for general adult medical examination without abnormal findings: Secondary | ICD-10-CM

## 2015-03-31 DIAGNOSIS — F4322 Adjustment disorder with anxiety: Secondary | ICD-10-CM

## 2015-03-31 DIAGNOSIS — Z01419 Encounter for gynecological examination (general) (routine) without abnormal findings: Secondary | ICD-10-CM | POA: Diagnosis present

## 2015-03-31 DIAGNOSIS — Z23 Encounter for immunization: Secondary | ICD-10-CM | POA: Diagnosis not present

## 2015-03-31 DIAGNOSIS — Z6829 Body mass index (BMI) 29.0-29.9, adult: Secondary | ICD-10-CM | POA: Diagnosis not present

## 2015-03-31 DIAGNOSIS — Z114 Encounter for screening for human immunodeficiency virus [HIV]: Secondary | ICD-10-CM

## 2015-03-31 LAB — CBC WITH DIFFERENTIAL/PLATELET
Basophils Absolute: 0.1 10*3/uL (ref 0.0–0.1)
Basophils Relative: 1 % (ref 0–1)
Eosinophils Absolute: 0.1 10*3/uL (ref 0.0–0.7)
Eosinophils Relative: 2 % (ref 0–5)
HEMATOCRIT: 39.5 % (ref 36.0–46.0)
HEMOGLOBIN: 13.1 g/dL (ref 12.0–15.0)
LYMPHS ABS: 2.2 10*3/uL (ref 0.7–4.0)
LYMPHS PCT: 36 % (ref 12–46)
MCH: 29.8 pg (ref 26.0–34.0)
MCHC: 33.2 g/dL (ref 30.0–36.0)
MCV: 89.8 fL (ref 78.0–100.0)
MONO ABS: 0.3 10*3/uL (ref 0.1–1.0)
MONOS PCT: 5 % (ref 3–12)
MPV: 8.7 fL (ref 8.6–12.4)
NEUTROS ABS: 3.5 10*3/uL (ref 1.7–7.7)
Neutrophils Relative %: 56 % (ref 43–77)
Platelets: 311 10*3/uL (ref 150–400)
RBC: 4.4 MIL/uL (ref 3.87–5.11)
RDW: 13 % (ref 11.5–15.5)
WBC: 6.2 10*3/uL (ref 4.0–10.5)

## 2015-03-31 LAB — COMPLETE METABOLIC PANEL WITH GFR
ALT: 19 U/L (ref 6–29)
AST: 17 U/L (ref 10–30)
Albumin: 4.4 g/dL (ref 3.6–5.1)
Alkaline Phosphatase: 51 U/L (ref 33–115)
BUN: 16 mg/dL (ref 7–25)
CO2: 24 mmol/L (ref 20–31)
Calcium: 9.3 mg/dL (ref 8.6–10.2)
Chloride: 106 mmol/L (ref 98–110)
Creat: 0.64 mg/dL (ref 0.50–1.10)
GFR, Est African American: >89 mL/min (ref >=60)
Glucose, Bld: 79 mg/dL (ref 65–99)
POTASSIUM: 4.3 mmol/L (ref 3.5–5.3)
SODIUM: 138 mmol/L (ref 135–146)
TOTAL PROTEIN: 7 g/dL (ref 6.1–8.1)
Total Bilirubin: 0.3 mg/dL (ref 0.2–1.2)

## 2015-03-31 LAB — LIPID PANEL
Cholesterol: 167 mg/dL (ref 125–200)
HDL: 39 mg/dL — AB (ref >=46)
LDL CALC: 117 mg/dL (ref <130)
Total CHOL/HDL Ratio: 4.3 Ratio (ref <=5.0)
Triglycerides: 57 mg/dL (ref <150)
VLDL: 11 mg/dL (ref <30)

## 2015-03-31 MED ORDER — ALPRAZOLAM 0.5 MG PO TABS: 0.5000 mg | ORAL_TABLET | Freq: Every day | ORAL | Status: AC | PRN

## 2015-03-31 NOTE — Progress Notes (Signed)
Subjective:     Leah Wells is a 34 y.o. female and is here for a comprehensive physical exam. The patient reports problems - says the xanax that she last had filled was in 2011.  Says feels like her anxiethy is higher than usualy. Says when she gets really anxious seh doesn't even want her husband to talk to her.  Says it afffect her for about a month and then the next month she will feel better. She denies any specific stresssors or strains. She is raising a 32 year old and a toddler. She does complain of feeling nervous and on edge several days of the week as well as feeling like she worries excessively. She also complains of irritability nearly every day.   She has been on a low carb diet and has lost a significant amount of weight. No regular exercise. She would like to make sure that she doesn't have any mineral or vitamin deficiencies.   Social History   Social History  . Marital Status: Married    Spouse Name: Ryne Griess   . Number of Children: 1  . Years of Education: GED   Occupational History  . customer service rep     [employer]   Social History Main Topics  . Smoking status: Former Smoker    Types: Cigarettes    Quit date: 07/03/2005  . Smokeless tobacco: Never Used  . Alcohol Use: Yes     Comment: social  . Drug Use: No  . Sexual Activity:    Partners: Male    Birth Control/ Protection: Injection     Comment: depo shot   Other Topics Concern  . Not on file   Social History Narrative   Daughter named Osborne Casco    Drinks tea about 3-5   Health Maintenance  Topic Date Due  . HIV Screening  06/14/1998  . TETANUS/TDAP  06/14/2002  . PAP SMEAR  06/14/2004  . INFLUENZA VACCINE  02/01/2015    The following portions of the patient's history were reviewed and updated as appropriate: allergies, current medications, past family history, past medical history, past social history, past surgical history and problem list.  Review of Systems A comprehensive review  of systems was negative.   Objective:    BP 118/66 mmHg  Pulse 62  Temp(Src) 98.7 F (37.1 C)  Wt 164 lb (74.39 kg)  LMP 02/09/2015 General appearance: alert, cooperative and appears stated age Head: Normocephalic, without obvious abnormality, atraumatic Eyes: conj clear, EOMI, PEERLA Ears: normal TM's and external ear canals both ears Nose: Nares normal. Septum midline. Mucosa normal. No drainage or sinus tenderness. Throat: lips, mucosa, and tongue normal; teeth and gums normal Neck: no adenopathy, no carotid bruit, no JVD, supple, symmetrical, trachea midline and thyroid not enlarged, symmetric, no tenderness/mass/nodules Back: symmetric, no curvature. ROM normal. No CVA tenderness. Lungs: clear to auscultation bilaterally Breasts: normal appearance, no masses or tenderness Heart: regular rate and rhythm, S1, S2 normal, no murmur, click, rub or gallop Abdomen: soft, non-tender; bowel sounds normal; no masses,  no organomegaly Pelvic: cervix normal in appearance, external genitalia normal, no adnexal masses or tenderness, no cervical motion tenderness, rectovaginal septum normal, uterus normal size, shape, and consistency and vagina normal without discharge Extremities: extremities normal, atraumatic, no cyanosis or edema Pulses: 2+ and symmetric Skin: Skin color, texture, turgor normal. No rashes or lesions Lymph nodes: Cervical, supraclavicular, and axillary nodes normal. Neurologic: Alert and oriented X 3, normal strength and tone. Normal symmetric reflexes. Normal  coordination and gait    Assessment:    Healthy female exam.      Plan:     See After Visit Summary for Counseling Recommendations  Keep up a regular exercise program and make sure you are eating a healthy diet Try to eat 4 servings of dairy a day, or if you are lactose intolerant take a calcium with vitamin D daily.  Your vaccines are up to date.  Pap smear performed today. We'll call results once  available.  Anxiety - gad 7 score of 12 today. Rates her symptoms is somewhat difficult. This is most consistent with moderate anxiety. We discussed treatment options including medication and possibly therapeutic counseling. She's concerned she really won't have time or be able to get off work with therapy or counseling. Certainly we could consider completing a familial paperwork so that she could attend but she may or may not be paid during those hours which can surly affect her family income. Certainly could consider an SSRI. We discussed pros and cons and the fact that they are not habit forming and are typically used temporarily for 6-12 months at a time. She wants to think about her options and let me know. I did go ahead and refill her Xanax. One prescription has lasted her almost 3 years.  BMI of 29-she is on a fantastic job with weight loss and praised her and gave her encouragement

## 2015-03-31 NOTE — Patient Instructions (Signed)
Keep up a regular exercise program and make sure you are eating a healthy diet Try to eat 4 servings of dairy a day, or if you are lactose intolerant take a calcium with vitamin D daily.  Your vaccines are up to date.   

## 2015-04-01 LAB — TSH: TSH: 1.849 u[IU]/mL (ref 0.350–4.500)

## 2015-04-01 LAB — VITAMIN D 25 HYDROXY (VIT D DEFICIENCY, FRACTURES): Vit D, 25-Hydroxy: 30 ng/mL (ref 30–100)

## 2015-04-01 LAB — FERRITIN: FERRITIN: 35 ng/mL (ref 10–291)

## 2015-04-01 LAB — VITAMIN B12: Vitamin B-12: 654 pg/mL (ref 211–911)

## 2015-04-01 LAB — CYTOLOGY - PAP

## 2015-04-02 ENCOUNTER — Telehealth: Payer: Self-pay | Admitting: *Deleted

## 2015-04-02 MED ORDER — ESCITALOPRAM OXALATE 10 MG PO TABS: 10.0000 mg | ORAL_TABLET | Freq: Every day | ORAL | Status: DC

## 2015-04-02 NOTE — Telephone Encounter (Signed)
Pt informed advised to call Monday to sched appt.Leah Wells

## 2015-04-02 NOTE — Telephone Encounter (Signed)
Prescription sent to the pharmacy. She will need to follow-up in about 4 weeks for an appointment.

## 2015-04-02 NOTE — Telephone Encounter (Signed)
Pt called and stated that she would like for Dr. Linford Arnold to send in the controller medication for her anxiety to be sent to her pharmacy. She said that she thought about it and she is ready to start.Leah Wells [place]

## 2015-04-14 ENCOUNTER — Encounter: Payer: Self-pay | Admitting: *Deleted

## 2015-04-14 ENCOUNTER — Emergency Department (INDEPENDENT_AMBULATORY_CARE_PROVIDER_SITE_OTHER)
Admission: EM | Admit: 2015-04-14 | Discharge: 2015-04-14 | Disposition: A | Discharge status: Home / Self Care | Payer: Managed Care, Other (non HMO) | Source: Home / Self Care | Attending: Family Medicine | Admitting: Family Medicine

## 2015-04-14 DIAGNOSIS — J029 Acute pharyngitis, unspecified: Secondary | ICD-10-CM

## 2015-04-14 DIAGNOSIS — K0510 Chronic gingivitis, plaque induced: Secondary | ICD-10-CM

## 2015-04-14 MED ORDER — CLINDAMYCIN HCL 150 MG PO CAPS: 300.0000 mg | ORAL_CAPSULE | Freq: Three times a day (TID) | ORAL | Status: DC

## 2015-04-14 NOTE — ED Provider Notes (Signed)
CSN: [id number]     Arrival date & time 04/14/15  1916 History   First MD Initiated Contact with Patient 04/14/15 1919     Chief Complaint  Patient presents with  . Sore Throat   (Consider location/radiation/quality/duration/timing/severity/associated sxs/prior Treatment) HPI Pt is a 34yo female presenting to [facility] with c/o gradually worsening sore throat and Right upper gingival pain that started 1 week ago.  Pt states she found a full bottle of amoxicillin so she started taking it 4 days ago,  TID.  Denies any relief.  Pt has a f/u with dentist Monday but states she is going to a wedding this weekend and doesn't want the symptoms to worsening. Throat and gum pain is aching and sore, worse with eating and swallowing. Mild to moderate in severity. Pt states she gets food stuck between her teeth often and needs to use a special toothpick with bristles and it has caused her gums to bleed at times. Pt believes this may have lead to an infection in her gums. Denies fever, chills, n/v/d.    Past Medical History  Diagnosis Date  . Neuropathy (HCC)     Legs and arma  . Chronic anxiety    Past Surgical History  Procedure Laterality Date  . Cesarean section  12/09/2005  . Tonsillectomy     Family History  Problem Relation Age of Onset  . Breast cancer Maternal Aunt   . Lung cancer Paternal Grandfather   . Thyroid cancer Sister   . Heart attack Maternal Grandmother     both sides  . Diabetes Maternal Grandmother   . Hyperlipidemia Mother   . Hypertension Mother   . Stroke Maternal Grandmother   . Crohn's disease Paternal Aunt   . Hyperlipidemia Father   . Hyperlipidemia Maternal Grandmother   . Hyperlipidemia Paternal Grandmother   . Hypertension Maternal Aunt   . Hypertension Maternal Grandmother    Social History  Substance Use Topics  . Smoking status: Former Smoker    Types: Cigarettes    Quit date: 07/03/2005  . Smokeless tobacco: Never Used  . Alcohol Use: Yes   Comment: social   OB History    No data available     Review of Systems  Constitutional: Negative for fever and chills.  HENT: Positive for dental problem (Right upper gum pain and swelling) and sore throat. Negative for congestion, ear pain, trouble swallowing and voice change.   Respiratory: Negative for cough and shortness of breath.   Cardiovascular: Negative for chest pain and palpitations.  Gastrointestinal: Negative for nausea, vomiting, abdominal pain and diarrhea.  Musculoskeletal: Negative for myalgias, back pain and arthralgias.  Skin: Negative for rash.  All other systems reviewed and are negative.   Allergies  Dust mite extract  Home Medications   Prior to Admission medications   Medication Sig Start Date End Date Taking? Authorizing Provider  ALPRAZolam Prudy Feeler) 0.5 MG tablet Take 1 tablet (0.5 mg total) by mouth daily as needed for anxiety. 03/31/15 04/30/15  Agapito Games, MD  clindamycin (CLEOCIN) 150 MG capsule Take 2 capsules (300 mg total) by mouth 3 (three) times daily. For 7 days 04/14/15   Junius Finner, PA-C  escitalopram (LEXAPRO) 10 MG tablet Take 1 tablet (10 mg total) by mouth daily. 04/02/15   Agapito Games, MD   Meds Ordered and Administered this Visit  Medications - No data to display  BP 129/83 mmHg  Pulse 78  Temp(Src) 98.5 F (36.9 C) (Oral)  Resp 16  Ht 5\' 3"  (1.6 m)  Wt 164 lb (74.39 kg)  BMI 29.06 kg/m2  SpO2 98%  LMP 02/09/2015 No data found.   Physical Exam  Constitutional: She appears well-developed and well-nourished. No distress.  HENT:  Head: Normocephalic and atraumatic.  Right Ear: Hearing, tympanic membrane, external ear and ear canal normal.  Left Ear: Hearing, tympanic membrane, external ear and ear canal normal.  Nose: Nose normal.  Mouth/Throat: Uvula is midline and mucous membranes are normal. Oral lesions present. No trismus in the jaw. Normal dentition. Dental caries present. No dental abscesses or  uvula swelling. Posterior oropharyngeal erythema present. No oropharyngeal exudate, posterior oropharyngeal edema or tonsillar abscesses.  Mild Right upper gingiva erythema and edema with tenderness.  Post oropharyngeal erythema but no tonsillar edema or exudate.  Scant red blood in posterior pharynx.   Eyes: Conjunctivae are normal. No scleral icterus.  Neck: Normal range of motion. Neck supple.  Cardiovascular: Normal rate, regular rhythm and normal heart sounds.   Pulmonary/Chest: Effort normal and breath sounds normal. No respiratory distress. She has no wheezes. She has no rales. She exhibits no tenderness.  Musculoskeletal: Normal range of motion.  Lymphadenopathy:    She has no cervical adenopathy.  Neurological: She is alert.  Skin: Skin is warm and dry. She is not diaphoretic.  Nursing note and vitals reviewed.   ED Course  Procedures (including critical care time)  Labs Review Labs Reviewed - No data to display  Imaging Review No results found.   MDM   1. Sore throat   2. Gingivitis    Pt c/o Right upper gum pain and sore throat. Pt does have a scant amount of red blood in posterior pharynx as well as gingival erythema and mild edema. No abscess present for I&D at this time. Pt has already completed at least a 5 day course of 500mg  amoxicillin. No strep test performed as it would likely be negative. Will place pt on Clindamycin for possible early onset dental abscess. Pt does have f/u appointment with dentist on Monday, 10/17. Strongly encouraged pt to keep appointment. Home care instructions provided including rinsing mouth with salt water gargles. Patient verbalized understanding and agreement with treatment plan.    Junius FinnerErin O'Malley, PA-C 04/15/15 [phone]

## 2015-04-14 NOTE — ED Notes (Signed)
Pt c/o sore throat x 1wk. Denies fever. Took Amoxicillin 500mg  tid x 4 days.

## 2015-04-14 NOTE — Discharge Instructions (Signed)
°  Please take antibiotics as prescribed and be sure to complete entire course even if you start to feel better to ensure infection does not come back. ° °

## 2015-04-20 NOTE — Telephone Encounter (Signed)
Error

## 2015-04-26 ENCOUNTER — Encounter: Payer: Self-pay | Admitting: Family Medicine

## 2015-04-26 ENCOUNTER — Ambulatory Visit (INDEPENDENT_AMBULATORY_CARE_PROVIDER_SITE_OTHER): Payer: Managed Care, Other (non HMO) | Admitting: Family Medicine

## 2015-04-26 VITALS — BP 115/57 | HR 60 | Wt 167.0 lb

## 2015-04-26 DIAGNOSIS — F419 Anxiety disorder, unspecified: Secondary | ICD-10-CM | POA: Diagnosis not present

## 2015-04-26 NOTE — Progress Notes (Signed)
   Subjective:    Patient ID: Leah Wells, female    DOB: 1980-09-01, 34 y.o.   MRN: [id number]  HPI F/u mood gad-7=3 (not difficult at all),doing well on Lexapro. No S.E.  No concerns. She says she feels like herself on it.  No tearfulness and sleeping well. She is not interested in counseling. She c/o of feeling anxiou several days a week and worrying too much.     Review of Systems     Objective:   Physical Exam  Constitutional: She is oriented to person, place, and time. She appears well-developed and well-nourished.  HENT:  Head: Normocephalic and atraumatic.  Cardiovascular: Normal rate, regular rhythm and normal heart sounds.   Pulmonary/Chest: Effort normal and breath sounds normal.  Neurological: She is alert and oriented to person, place, and time.  Skin: Skin is warm and dry.  Psychiatric: She has a normal mood and affect. Her behavior is normal.          Assessment & Plan:  Anxiety - GAD- 7 score of 3 today.  Continue current regimen. F/U in 3 months. Disscused staying on the medicaiton for 6-12 months.

## 2015-05-28 ENCOUNTER — Other Ambulatory Visit: Payer: Self-pay | Admitting: Family Medicine

## 2015-06-22 ENCOUNTER — Other Ambulatory Visit: Payer: Self-pay | Admitting: Family Medicine

## 2015-06-22 MED ORDER — ESCITALOPRAM OXALATE 10 MG PO TABS: ORAL_TABLET | ORAL | Status: DC

## 2015-07-27 ENCOUNTER — Ambulatory Visit: Payer: Managed Care, Other (non HMO) | Admitting: Family Medicine

## 2015-09-09 ENCOUNTER — Other Ambulatory Visit: Payer: Self-pay | Admitting: Family Medicine

## 2015-09-10 MED ORDER — VALACYCLOVIR HCL 1 G PO TABS: 1000.0000 mg | ORAL_TABLET | Freq: Two times a day (BID) | ORAL | Status: AC

## 2015-09-24 IMAGING — US US ABDOMEN COMPLETE
1 series · 14 of 25 positions shown · non-contrast
Comparison: CT scan of the abdomen pelvis dated November 02, 2011

CLINICAL DATA: Right upper quadrant tenderness and nausea

EXAM:
ULTRASOUND ABDOMEN COMPLETE

[Series 1: us abdomen complete · 0.32mm/px · 14 of 77 slices shown]
[im 1/77]
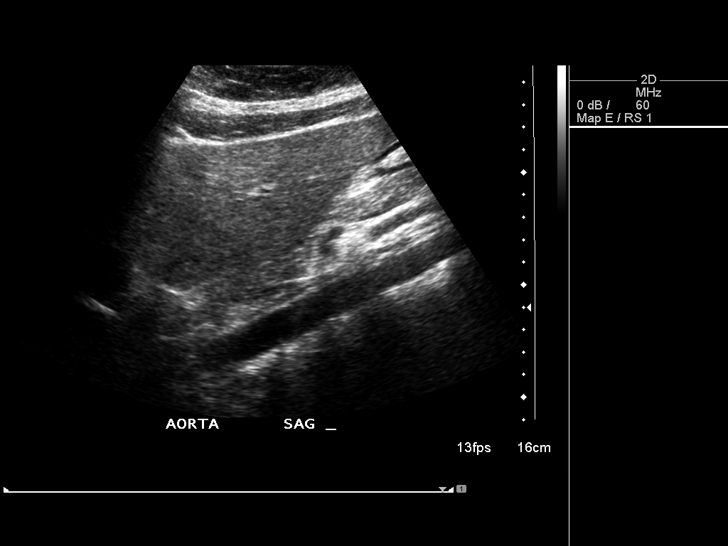
[im 7/77]
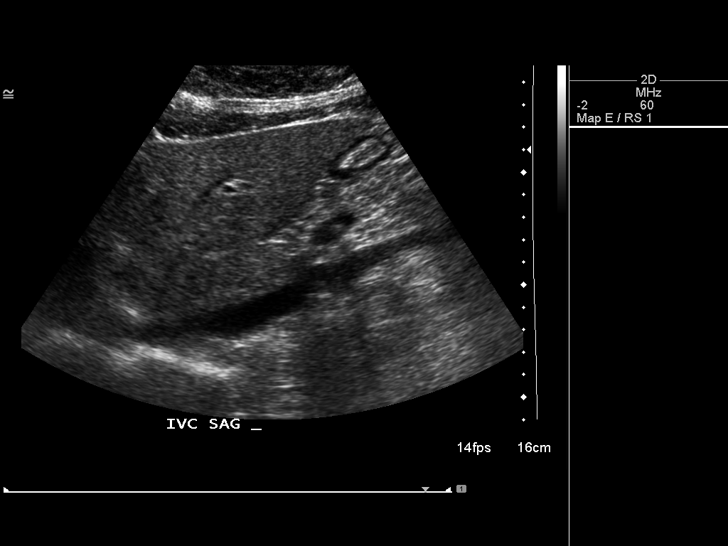
[im 13/77]
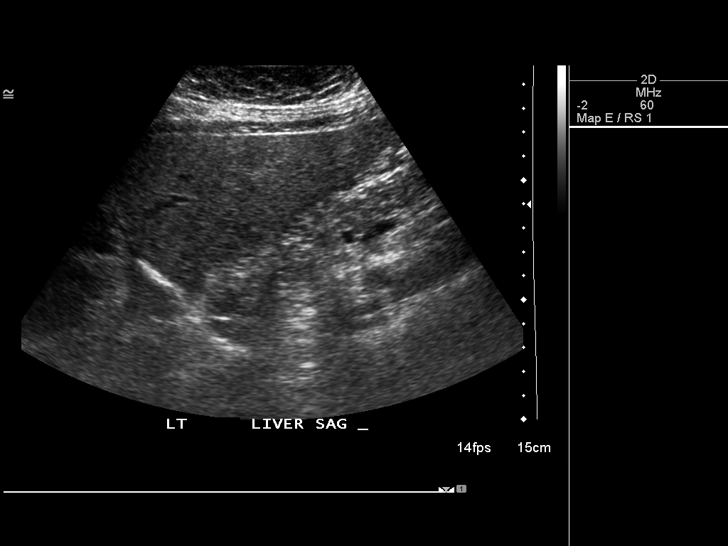
[im 20/77]
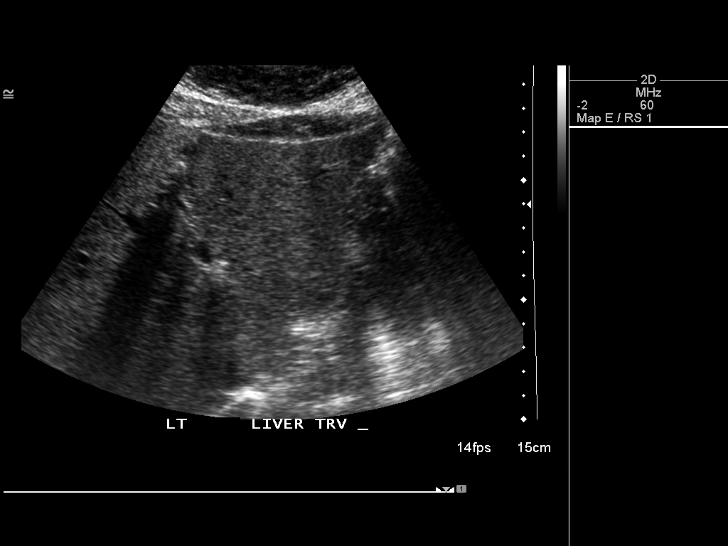
[im 26/77]
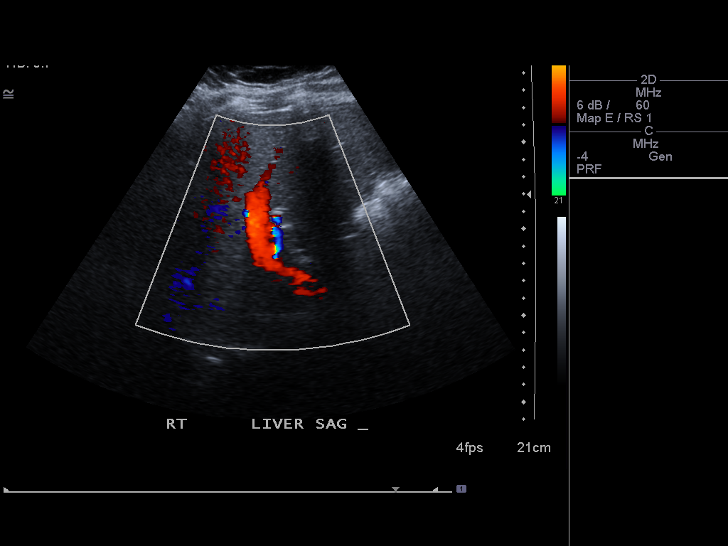
[im 29/77]
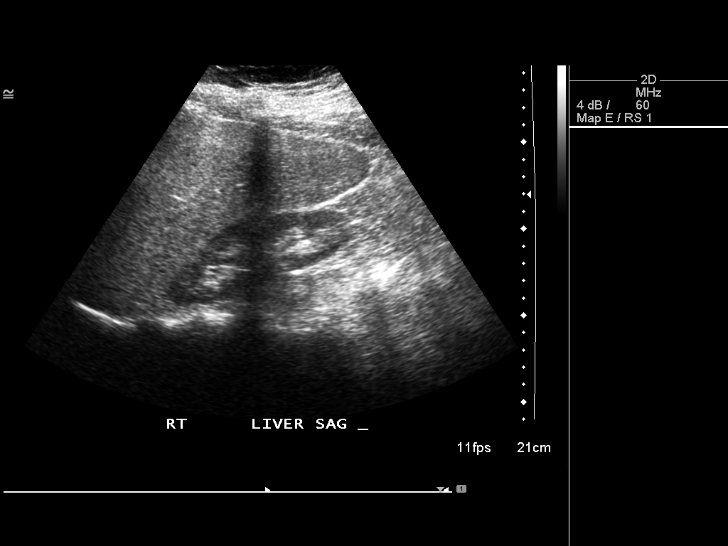
[im 35/77]
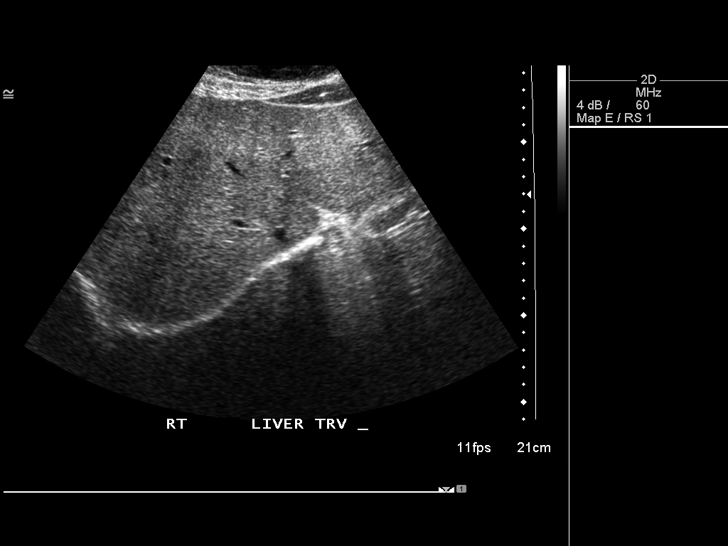
[im 42/77]
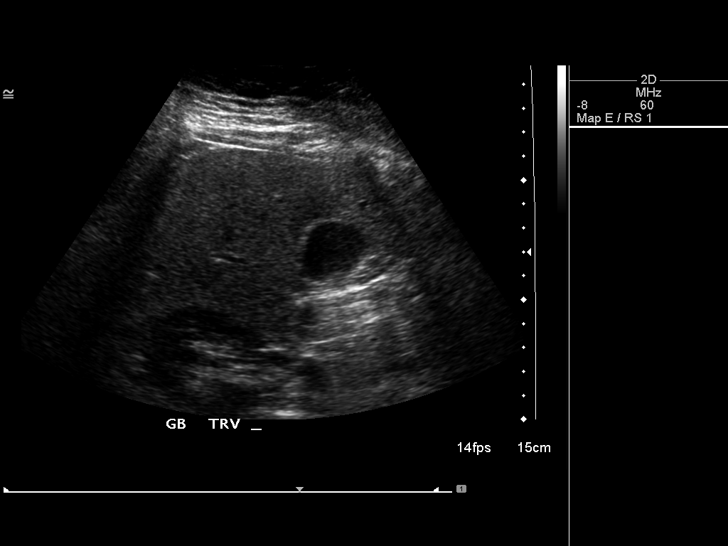
[im 48/77]
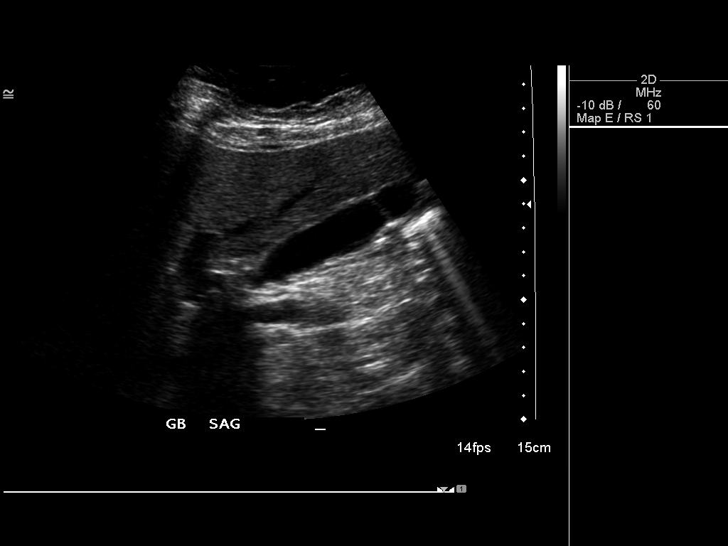
[im 51/77]
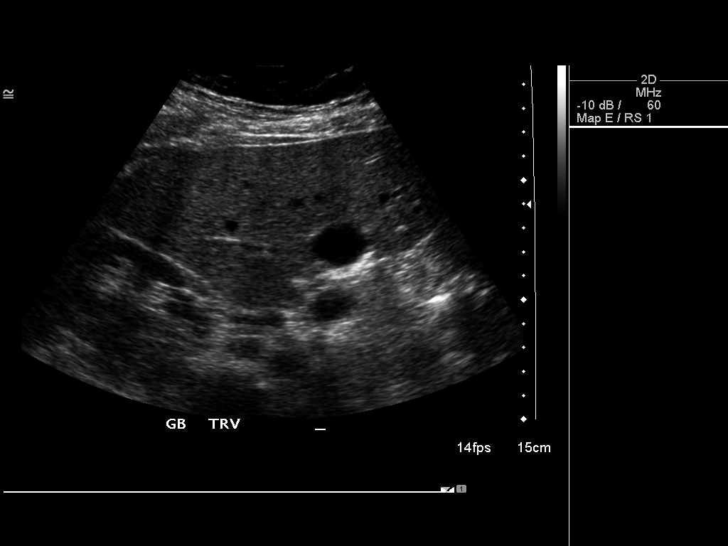
[im 58/77]
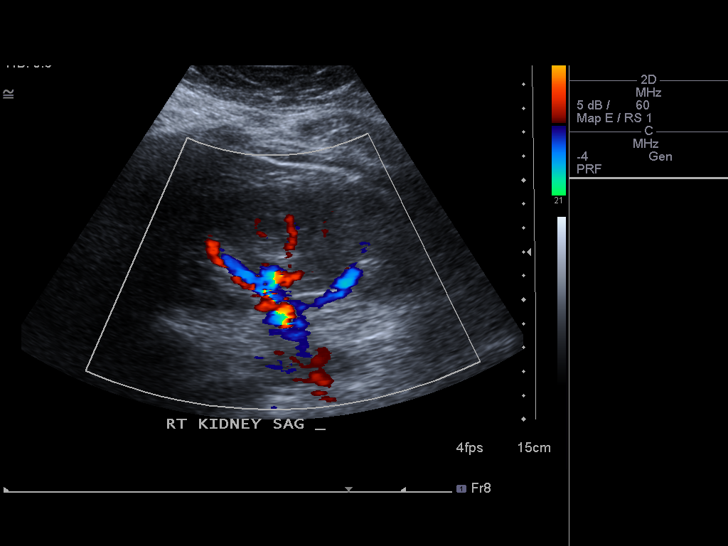
[im 64/77]
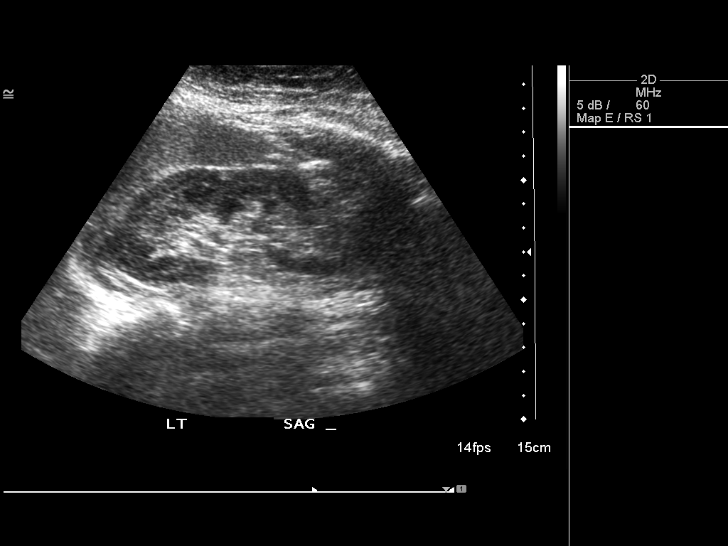
[im 70/77]
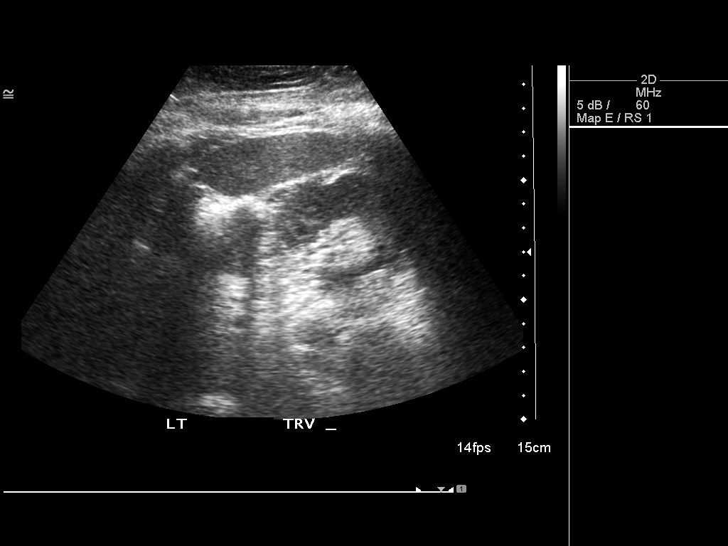
[im 77/77]
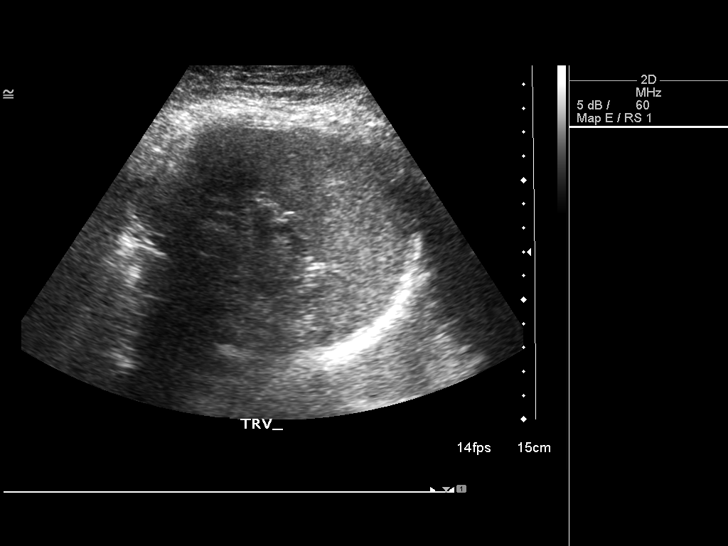

[14 of 25 positions shown; findings below may reference images not displayed]

FINDINGS: Gallbladder:

No gallstones or wall thickening visualized. No sonographic Murphy
sign noted.

Common bile duct:

Diameter: 2.1 mm

Liver:

No focal lesion identified. Within normal limits in parenchymal
echogenicity.

IVC:

Evaluation is limited by bowel gas.

Pancreas:

Bowel gas limits evaluation of the pancreas.

Spleen:

Size and appearance within normal limits.

Right Kidney:

Length: 12.8 cm. Echogenicity within normal limits. No mass or
hydronephrosis visualized.

Left Kidney:

Length: 10.1 cm. Echogenicity within normal limits. No mass or
hydronephrosis visualized.

Abdominal aorta:

Evaluation limited by bowel gas.

Other findings:

No ascites is demonstrated.
IMPRESSION: There is no acute intra-abdominal abnormality demonstrated. Bowel
gas does limit evaluation of the pancreas and abdominal aorta.

## 2015-10-04 ENCOUNTER — Ambulatory Visit: Payer: Managed Care, Other (non HMO) | Admitting: Family Medicine

## 2015-10-05 ENCOUNTER — Encounter: Payer: Self-pay | Admitting: Family Medicine

## 2015-10-05 ENCOUNTER — Ambulatory Visit (INDEPENDENT_AMBULATORY_CARE_PROVIDER_SITE_OTHER): Payer: Managed Care, Other (non HMO) | Admitting: Family Medicine

## 2015-10-05 VITALS — BP 119/55 | HR 82 | Wt 185.0 lb

## 2015-10-05 DIAGNOSIS — F411 Generalized anxiety disorder: Secondary | ICD-10-CM | POA: Diagnosis not present
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
## 2015-11-13 ENCOUNTER — Other Ambulatory Visit: Payer: Self-pay | Admitting: Family Medicine

## 2016-01-06 ENCOUNTER — Encounter: Payer: Self-pay | Admitting: Family Medicine

## 2016-01-06 ENCOUNTER — Ambulatory Visit (INDEPENDENT_AMBULATORY_CARE_PROVIDER_SITE_OTHER): Payer: Managed Care, Other (non HMO) | Admitting: Family Medicine

## 2016-01-06 VITALS — BP 112/61 | HR 82 | Wt 193.0 lb

## 2016-01-06 DIAGNOSIS — F411 Generalized anxiety disorder: Secondary | ICD-10-CM | POA: Diagnosis not present

## 2016-01-06 DIAGNOSIS — Z808 Family history of malignant neoplasm of other organs or systems: Secondary | ICD-10-CM | POA: Diagnosis not present

## 2016-01-06 DIAGNOSIS — H04123 Dry eye syndrome of bilateral lacrimal glands: Secondary | ICD-10-CM | POA: Diagnosis not present

## 2016-01-06 DIAGNOSIS — N926 Irregular menstruation, unspecified: Secondary | ICD-10-CM | POA: Diagnosis not present

## 2016-01-06 DIAGNOSIS — Z23 Encounter for immunization: Secondary | ICD-10-CM

## 2016-01-06 LAB — COMPLETE METABOLIC PANEL WITH GFR
ALBUMIN: 4.7 g/dL (ref 3.6–5.1)
ALK PHOS: 46 U/L (ref 33–115)
ALT: 15 U/L (ref 6–29)
AST: 19 U/L (ref 10–30)
BILIRUBIN TOTAL: 0.4 mg/dL (ref 0.2–1.2)
BUN: 19 mg/dL (ref 7–25)
CALCIUM: 9.9 mg/dL (ref 8.6–10.2)
CO2: 19 mmol/L — ABNORMAL LOW (ref 20–31)
CREATININE: 1.07 mg/dL (ref 0.50–1.10)
Chloride: 103 mmol/L (ref 98–110)
GFR, Est African American: 79 mL/min (ref >=60)
GFR, Est Non African American: 69 mL/min (ref >=60)
GLUCOSE: 79 mg/dL (ref 65–99)
POTASSIUM: 4.4 mmol/L (ref 3.5–5.3)
SODIUM: 139 mmol/L (ref 135–146)
TOTAL PROTEIN: 7.8 g/dL (ref 6.1–8.1)

## 2016-01-06 LAB — POCT URINE PREGNANCY: PREG TEST UR: NEGATIVE

## 2016-01-06 LAB — TSH: TSH: 1.88 m[IU]/L

## 2016-01-06 LAB — C-REACTIVE PROTEIN

## 2016-01-06 MED ORDER — ESCITALOPRAM OXALATE 10 MG PO TABS: ORAL_TABLET | ORAL | Status: AC

## 2016-01-06 NOTE — Progress Notes (Signed)
Subjective:    CC: 3 months for mood.   HPI: Anxiety -  She's been struggling a little bit more recently with her anxiety but says it's mostly because she has been forgetting her medication. She said when she takes the Lexapro regularly it does seem to make a big difference. She is just having a hard time with consistency. She does complain of feeling nervous and on edge more than half the days. She still getting occasional panic attacks but says they have changed in nature. Before she would just get angry and irritable outbursts. Now she almost feels more disconnected from her body.  She's also one week late for her menstrual cycle. She had her tubes tied about 2 years ago just wants to make sure that she is not pregnant. She has had some breast tenderness.  She also reports dry eyes. She's noticed it over the last couple of months. She says she's always had a little bit of dryness but it seems significantly worse. Her mother has a diagnosis of scleroderma and she has a sister with a prior history of thyroid cancer. She actually recently had her eye exam and says it's up-to-date.  Past medical history, Surgical history, Family history not pertinant except as noted below, Social history, Allergies, and medications have been entered into the medical record, reviewed, and corrections made.   Review of Systems: No fevers, chills, night sweats, weight loss, chest pain, or shortness of breath.   Objective:    General: Well Developed, well nourished, and in no acute distress.  Neuro: Alert and oriented x3, extra-ocular muscles intact, sensation grossly intact.  HEENT: Normocephalic, atraumatic  Skin: Warm and dry, no rashes. Cardiac: Regular rate and rhythm, no murmurs rubs or gallops, no lower extremity edema.  Respiratory: Clear to auscultation bilaterally. Not using accessory muscles, speaking in full sentences.   Impression and Recommendations:    GAD- GAD 7 score of 7 today, previous of 5.  She rates her symptoms is somewhat difficult.We discussed strategies to help her be more consistent. There are smart phone app that can be used as reminders. There is a reminder feature on her calendar. She can as love ones including her husband to help remind her. And taking it at the same time as her son takes his vitamins be helpful as well. We also again discussed considering therapy/counseling. She will think about it.  Dry eye-we'll do additional workup since she does have a family history of scleroderma. Consider Sjogren's as a possible diagnosis. We'll also check for thyroid abnormality.  Missed periods. Urine pregnancy test was negative today. Continue to monitor for any signs or symptoms. She may likely just be skipping her period this month.

## 2016-01-07 LAB — SJOGRENS SYNDROME-A EXTRACTABLE NUCLEAR ANTIBODY: SSA (RO) (ENA) ANTIBODY, IGG: NEGATIVE

## 2016-01-07 LAB — SEDIMENTATION RATE: Sed Rate: 7 mm/hr (ref 0–20)

## 2016-01-07 LAB — SJOGRENS SYNDROME-B EXTRACTABLE NUCLEAR ANTIBODY: SSB (LA) (ENA) ANTIBODY, IGG: NEGATIVE

## 2016-01-07 LAB — ANA: Anti Nuclear Antibody(ANA): NEGATIVE

## 2016-01-20 ENCOUNTER — Other Ambulatory Visit: Payer: Self-pay | Admitting: Family Medicine

## 2016-03-11 ENCOUNTER — Other Ambulatory Visit: Payer: Self-pay | Admitting: Family Medicine

## 2016-05-08 ENCOUNTER — Ambulatory Visit: Payer: Managed Care, Other (non HMO) | Admitting: Family Medicine

## 2016-05-08 NOTE — Progress Notes (Deleted)
Subjective:    CC: Anxiety  HPI: Four-month follow-up for anxiety-when I last saw her she was struggling with taking her Lexapro consistently. We have discussed some strategies around helping her with smart phone reminders etc.  Past medical history, Surgical history, Family history not pertinant except as noted below, Social history, Allergies, and medications have been entered into the medical record, reviewed, and corrections made.   Review of Systems: No fevers, chills, night sweats, weight loss, chest pain, or shortness of breath.   Objective:    General: Well Developed, well nourished, and in no acute distress.  Neuro: Alert and oriented x3, extra-ocular muscles intact, sensation grossly intact.  HEENT: Normocephalic, atraumatic  Skin: Warm and dry, no rashes. Cardiac: Regular rate and rhythm, no murmurs rubs or gallops, no lower extremity edema.  Respiratory: Clear to auscultation bilaterally. Not using accessory muscles, speaking in full sentences.   Impression and Recommendations:    Anxiety -
